# Patient Record
Sex: Male | Born: 1960 | Race: White | Hispanic: No | Marital: Single | Smoking: Never smoker
Health system: Southern US, Community
[De-identification: ages and names within clinical notes are randomized; demographics above are authoritative.]

## PROBLEM LIST (undated history)

## (undated) DIAGNOSIS — I10 Essential (primary) hypertension: Secondary | ICD-10-CM

## (undated) DIAGNOSIS — K6289 Other specified diseases of anus and rectum: Secondary | ICD-10-CM

## (undated) DIAGNOSIS — M1990 Unspecified osteoarthritis, unspecified site: Secondary | ICD-10-CM

## (undated) DIAGNOSIS — K649 Unspecified hemorrhoids: Secondary | ICD-10-CM

## (undated) DIAGNOSIS — E559 Vitamin D deficiency, unspecified: Secondary | ICD-10-CM

## (undated) DIAGNOSIS — Z973 Presence of spectacles and contact lenses: Secondary | ICD-10-CM

## (undated) DIAGNOSIS — K219 Gastro-esophageal reflux disease without esophagitis: Secondary | ICD-10-CM

## (undated) DIAGNOSIS — E782 Mixed hyperlipidemia: Secondary | ICD-10-CM

## (undated) DIAGNOSIS — T7840XA Allergy, unspecified, initial encounter: Secondary | ICD-10-CM

## (undated) DIAGNOSIS — N401 Enlarged prostate with lower urinary tract symptoms: Secondary | ICD-10-CM

## (undated) DIAGNOSIS — R7303 Prediabetes: Secondary | ICD-10-CM

## (undated) DIAGNOSIS — Z8719 Personal history of other diseases of the digestive system: Secondary | ICD-10-CM

## (undated) HISTORY — DX: Allergy, unspecified, initial encounter: T78.40XA

## (undated) HISTORY — DX: Gastro-esophageal reflux disease without esophagitis: K21.9

---
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
## 2007-11-23 ENCOUNTER — Ambulatory Visit: Payer: Self-pay | Admitting: Gastroenterology

## 2015-03-01 ENCOUNTER — Other Ambulatory Visit: Payer: Self-pay | Admitting: Family Medicine

## 2015-04-13 ENCOUNTER — Other Ambulatory Visit: Payer: Self-pay | Admitting: Family Medicine

## 2015-04-20 ENCOUNTER — Encounter: Payer: Self-pay | Admitting: Family Medicine

## 2015-04-20 ENCOUNTER — Ambulatory Visit (INDEPENDENT_AMBULATORY_CARE_PROVIDER_SITE_OTHER): Payer: BLUE CROSS/BLUE SHIELD | Admitting: Family Medicine

## 2015-04-20 VITALS — BP 116/79 | HR 73 | Temp 97.5°F | Resp 16 | Ht 69.0 in | Wt 213.4 lb

## 2015-04-20 DIAGNOSIS — Z23 Encounter for immunization: Secondary | ICD-10-CM | POA: Diagnosis not present

## 2015-04-20 DIAGNOSIS — K219 Gastro-esophageal reflux disease without esophagitis: Secondary | ICD-10-CM

## 2015-04-20 DIAGNOSIS — Z889 Allergy status to unspecified drugs, medicaments and biological substances status: Secondary | ICD-10-CM | POA: Diagnosis not present

## 2015-04-20 DIAGNOSIS — N400 Enlarged prostate without lower urinary tract symptoms: Secondary | ICD-10-CM | POA: Diagnosis not present

## 2015-04-20 DIAGNOSIS — Z0000 Encounter for general adult medical examination without abnormal findings: Secondary | ICD-10-CM | POA: Diagnosis not present

## 2015-04-20 DIAGNOSIS — E559 Vitamin D deficiency, unspecified: Secondary | ICD-10-CM | POA: Diagnosis not present

## 2015-04-20 MED ORDER — OMEPRAZOLE 20 MG PO CPDR: 20.0000 mg | DELAYED_RELEASE_CAPSULE | Freq: Every day | ORAL | Status: DC

## 2015-04-20 MED ORDER — SILODOSIN 4 MG PO CAPS: 4.0000 mg | ORAL_CAPSULE | Freq: Every day | ORAL | Status: DC

## 2015-04-20 NOTE — Progress Notes (Signed)
Name: Jonathan Ortega   MRN: [id number]    DOB: 07-Jul-1960   Date:04/20/2015       Progress Note  Subjective  Chief Complaint  Chief Complaint  Patient presents with  . Annual Exam    HPI Here for Annual Physical..  Has GERD,  BPH.  He is having some nocturia x 1.  Sl. More freq. During the day also.  No dysuria.  Stream is pretty good.   Some dribbling at end.  He would like better results. No problem-specific assessment & plan notes found for this encounter.   Past Medical History  Diagnosis Date  . Allergy   . GERD (gastroesophageal reflux disease)     History reviewed. No pertinent past surgical history.  Family History  Problem Relation Age of Onset  . Hypertension Paternal Uncle   . Hypertension Maternal Grandmother     Social History   Social History  . Marital Status: Single    Spouse Name: N/A  . Number of Children: N/A  . Years of Education: N/A   Occupational History  . Not on file.   Social History Main Topics  . Smoking status: Never Smoker   . Smokeless tobacco: Never Used  . Alcohol Use: No  . Drug Use: No  . Sexual Activity: Not on file   Other Topics Concern  . Not on file   Social History Narrative  . No narrative on file     Current outpatient prescriptions:  .  cholecalciferol (VITAMIN D) 1000 UNITS tablet, Take 1,000 Units by mouth daily., Disp: , Rfl:  .  loratadine (CLARITIN) 10 MG tablet, Take 10 mg by mouth daily., Disp: , Rfl:  .  omeprazole (PRILOSEC) 20 MG capsule, Take 1 capsule (20 mg total) by mouth daily., Disp: 90 capsule, Rfl: 3 .  silodosin (RAPAFLO) 4 MG CAPS capsule, Take 1 capsule (4 mg total) by mouth daily with breakfast., Disp: 90 capsule, Rfl: 3  No Known Allergies   Review of Systems  Constitutional: Negative for fever, chills, weight loss and malaise/fatigue.  HENT: Positive for congestion. Negative for ear pain, hearing loss and sore throat.   Eyes: Negative for blurred vision, double vision and pain.   Respiratory: Negative for cough, sputum production, shortness of breath and wheezing.   Cardiovascular: Negative for chest pain, palpitations and leg swelling.  Gastrointestinal: Positive for heartburn (rare). Negative for abdominal pain and blood in stool.  Genitourinary: Positive for frequency. Negative for dysuria and urgency.  Musculoskeletal: Negative for myalgias and joint pain.  Skin: Negative for itching and rash.  Neurological: Negative for dizziness, tremors, sensory change, focal weakness, weakness and headaches.      Objective  Filed Vitals:   04/20/15 1508  BP: 116/79  Pulse: 73  Temp: 97.5 F (36.4 C)  Resp: 16  Height: 5\' 9"  (1.753 m)  Weight: 213 lb 6.4 oz (96.798 kg)    Physical Exam  Constitutional: He is oriented to person, place, and time and well-developed, well-nourished, and in no distress. No distress.  HENT:  Head: Normocephalic and atraumatic.  Left Ear: External ear normal.  Mouth/Throat: Oropharynx is clear and moist.  Eyes: Conjunctivae and EOM are normal. Pupils are equal, round, and reactive to light. No scleral icterus.  Fundoscopic exam:      The right eye shows no arteriolar narrowing, no AV nicking, no exudate, no hemorrhage and no papilledema.       The left eye shows no arteriolar narrowing, no AV nicking,  no exudate, no hemorrhage and no papilledema.  Neck: Normal range of motion. Neck supple. Carotid bruit is not present. No thyromegaly present.  Cardiovascular: Normal rate, regular rhythm, normal heart sounds and intact distal pulses.  Exam reveals no gallop and no friction rub.   No murmur heard. Pulmonary/Chest: Effort normal and breath sounds normal. No respiratory distress. He has no wheezes. He has no rales. He exhibits no tenderness.  Abdominal: Soft. Bowel sounds are normal. He exhibits no distension, no abdominal bruit and no mass. There is no tenderness.  Genitourinary: Prostate normal and penis normal.  Musculoskeletal:  Normal range of motion. He exhibits no edema or tenderness.  Lymphadenopathy:    He has no cervical adenopathy.  Neurological: He is alert and oriented to person, place, and time. He displays normal reflexes. No cranial nerve deficit. He exhibits normal muscle tone. Gait normal. Coordination normal.  Skin: Skin is warm and dry.  Psychiatric: Mood, memory, affect and judgment normal.  Vitals reviewed.      No results found for this or any previous visit (from the past 2160 hour(s)).   Assessment & Plan  Problem List Items Addressed This Visit      Digestive   GERD (gastroesophageal reflux disease)   Relevant Medications   omeprazole (PRILOSEC) 20 MG capsule   Other Relevant Orders   CBC with Differential     Genitourinary   BPH (benign prostatic hyperplasia)   Relevant Medications   silodosin (RAPAFLO) 4 MG CAPS capsule   Other Relevant Orders   PSA     Other   H/O seasonal allergies   Vitamin D deficiency   Relevant Orders   VITAMIN D 25 Hydroxy (Vit-D Deficiency, Fractures)    Other Visit Diagnoses    Annual physical exam    -  Primary    Relevant Orders    Comprehensive Metabolic Panel (CMET)    Lipid Profile    Need for influenza vaccination        Relevant Orders    Flu Vaccine QUAD 36+ mos PF IM (Fluarix & Fluzone Quad PF)       Meds ordered this encounter  Medications  . DISCONTD: omeprazole (PRILOSEC) 20 MG capsule    Sig: Take 20 mg by mouth daily.  [name] loratadine (CLARITIN) 10 MG tablet    Sig: Take 10 mg by mouth daily.  . cholecalciferol (VITAMIN D) 1000 UNITS tablet    Sig: Take 1,000 Units by mouth daily.  [name] omeprazole (PRILOSEC) 20 MG capsule    Sig: Take 1 capsule (20 mg total) by mouth daily.    Dispense:  90 capsule    Refill:  3  . silodosin (RAPAFLO) 4 MG CAPS capsule    Sig: Take 1 capsule (4 mg total) by mouth daily with breakfast.    Dispense:  90 capsule    Refill:  3   1. Annual physical exam  - Comprehensive Metabolic Panel  (CMET) - Lipid Profile  2. Gastroesophageal reflux disease, esophagitis presence not specified  - CBC with Differential - omeprazole (PRILOSEC) 20 MG capsule; Take 1 capsule (20 mg total) by mouth daily.  Dispense: 90 capsule; Refill: 3  3. Need for influenza vaccination  - Flu Vaccine QUAD 36+ mos PF IM (Fluarix & Fluzone Quad PF)  4. H/O seasonal allergies   5. Vitamin D deficiency  - VITAMIN D 25 Hydroxy (Vit-D Deficiency, Fractures)  6. BPH (benign prostatic hyperplasia)  - PSA - silodosin (RAPAFLO) 4 MG CAPS capsule;  Take 1 capsule (4 mg total) by mouth daily with breakfast.  Dispense: 90 capsule; Refill: 3

## 2015-04-22 LAB — CBC WITH DIFFERENTIAL/PLATELET
BASOS ABS: 0 10*3/uL (ref 0.0–0.2)
BASOS: 0 %
EOS (ABSOLUTE): 0.3 10*3/uL (ref 0.0–0.4)
Eos: 4 %
HEMATOCRIT: 48.6 % (ref 37.5–51.0)
Hemoglobin: 16.3 g/dL (ref 12.6–17.7)
IMMATURE GRANULOCYTES: 0 %
Immature Grans (Abs): 0 10*3/uL (ref 0.0–0.1)
LYMPHS: 19 %
Lymphocytes Absolute: 1.7 10*3/uL (ref 0.7–3.1)
MCH: 31.6 pg (ref 26.6–33.0)
MCHC: 33.5 g/dL (ref 31.5–35.7)
MCV: 94 fL (ref 79–97)
MONOCYTES: 10 %
Monocytes Absolute: 0.8 10*3/uL (ref 0.1–0.9)
NEUTROS PCT: 67 %
Neutrophils Absolute: 5.9 10*3/uL (ref 1.4–7.0)
PLATELETS: 158 10*3/uL (ref 150–379)
RBC: 5.16 x10E6/uL (ref 4.14–5.80)
RDW: 13 % (ref 12.3–15.4)
WBC: 8.8 10*3/uL (ref 3.4–10.8)

## 2015-04-22 LAB — LIPID PANEL
CHOLESTEROL TOTAL: 186 mg/dL (ref 100–199)
Chol/HDL Ratio: 5 ratio units (ref 0.0–5.0)
HDL: 37 mg/dL — AB (ref >39)
LDL Calculated: 117 mg/dL — ABNORMAL HIGH (ref 0–99)
TRIGLYCERIDES: 159 mg/dL — AB (ref 0–149)
VLDL Cholesterol Cal: 32 mg/dL (ref 5–40)

## 2015-04-22 LAB — VITAMIN D 25 HYDROXY (VIT D DEFICIENCY, FRACTURES): Vit D, 25-Hydroxy: 42.2 ng/mL (ref 30.0–100.0)

## 2015-04-22 LAB — COMPREHENSIVE METABOLIC PANEL
A/G RATIO: 2 (ref 1.1–2.5)
ALK PHOS: 79 IU/L (ref 39–117)
ALT: 48 IU/L — AB (ref 0–44)
AST: 30 IU/L (ref 0–40)
Albumin: 4.7 g/dL (ref 3.5–5.5)
BUN/Creatinine Ratio: 11 (ref 9–20)
BUN: 12 mg/dL (ref 6–24)
Bilirubin Total: 1.3 mg/dL — ABNORMAL HIGH (ref 0.0–1.2)
CALCIUM: 9.4 mg/dL (ref 8.7–10.2)
CO2: 26 mmol/L (ref 18–29)
CREATININE: 1.12 mg/dL (ref 0.76–1.27)
Chloride: 100 mmol/L (ref 97–106)
GFR calc Af Amer: 86 mL/min/{1.73_m2} (ref >59)
GFR calc non Af Amer: 75 mL/min/{1.73_m2} (ref >59)
GLOBULIN, TOTAL: 2.3 g/dL (ref 1.5–4.5)
Glucose: 103 mg/dL — ABNORMAL HIGH (ref 65–99)
POTASSIUM: 4.5 mmol/L (ref 3.5–5.2)
SODIUM: 142 mmol/L (ref 136–144)
Total Protein: 7 g/dL (ref 6.0–8.5)

## 2015-04-22 LAB — PSA: Prostate Specific Ag, Serum: 0.7 ng/mL (ref 0.0–4.0)

## 2015-10-19 ENCOUNTER — Ambulatory Visit (INDEPENDENT_AMBULATORY_CARE_PROVIDER_SITE_OTHER): Payer: BLUE CROSS/BLUE SHIELD | Admitting: Family Medicine

## 2015-10-19 ENCOUNTER — Encounter: Payer: Self-pay | Admitting: Family Medicine

## 2015-10-19 VITALS — BP 134/71 | HR 81 | Temp 97.9°F | Resp 16 | Ht 69.0 in | Wt 217.0 lb

## 2015-10-19 DIAGNOSIS — R748 Abnormal levels of other serum enzymes: Secondary | ICD-10-CM | POA: Diagnosis not present

## 2015-10-19 DIAGNOSIS — N400 Enlarged prostate without lower urinary tract symptoms: Secondary | ICD-10-CM

## 2015-10-19 DIAGNOSIS — R7303 Prediabetes: Secondary | ICD-10-CM | POA: Insufficient documentation

## 2015-10-19 DIAGNOSIS — K219 Gastro-esophageal reflux disease without esophagitis: Secondary | ICD-10-CM

## 2015-10-19 DIAGNOSIS — R739 Hyperglycemia, unspecified: Secondary | ICD-10-CM

## 2015-10-19 DIAGNOSIS — R7309 Other abnormal glucose: Secondary | ICD-10-CM | POA: Diagnosis not present

## 2015-10-19 DIAGNOSIS — M7711 Lateral epicondylitis, right elbow: Secondary | ICD-10-CM | POA: Diagnosis not present

## 2015-10-19 MED ORDER — OMEPRAZOLE 20 MG PO CPDR: 20.0000 mg | DELAYED_RELEASE_CAPSULE | Freq: Every day | ORAL | Status: DC

## 2015-10-19 MED ORDER — SILODOSIN 4 MG PO CAPS: 4.0000 mg | ORAL_CAPSULE | Freq: Every day | ORAL | Status: DC

## 2015-10-19 NOTE — Progress Notes (Signed)
Name: Jonathan Ortega   MRN: [id number]    DOB: [birth year]   Date:10/19/2015       Progress Note  Subjective  Chief Complaint  Chief Complaint  Patient presents with  . Gastroesophageal Reflux    6 month follow up    HPI  Hee for f/u of GERD and BPH.  Has nocturia x 1.  Unchanged.  Has occ./ extra reflux.  Occ. Takes Zantac extra.  Overall feeling ok.    Does c/o R elbow pain with extra activity. No problem-specific assessment & plan notes found for this encounter.   Past Medical History  Diagnosis Date  . Allergy   . GERD (gastroesophageal reflux disease)     History reviewed. No pertinent past surgical history.  Family History  Problem Relation Age of Onset  . Hypertension Paternal Uncle   . Hypertension Maternal Grandmother     Social History   Social History  . Marital Status: Single    Spouse Name: N/A  . Number of Children: N/A  . Years of Education: N/A   Occupational History  . Not on file.   Social History Main Topics  . Smoking status: Never Smoker   . Smokeless tobacco: Never Used  . Alcohol Use: 0.0 oz/week    0 Standard drinks or equivalent per week  . Drug Use: No  . Sexual Activity: Not on file   Other Topics Concern  . Not on file   Social History Narrative     Current outpatient prescriptions:  .  cholecalciferol (VITAMIN D) 1000 UNITS tablet, Take 1,000 Units by mouth daily., Disp: , Rfl:  .  loratadine (CLARITIN) 10 MG tablet, Take 10 mg by mouth daily., Disp: , Rfl:  .  omeprazole (PRILOSEC) 20 MG capsule, Take 1 capsule (20 mg total) by mouth daily., Disp: 90 capsule, Rfl: 3 .  silodosin (RAPAFLO) 4 MG CAPS capsule, Take 1 capsule (4 mg total) by mouth daily with breakfast., Disp: 90 capsule, Rfl: 3  Not on File   Review of Systems  Constitutional: Negative for fever, chills, weight loss and malaise/fatigue.  HENT: Negative for hearing loss.   Eyes: Negative for blurred vision and double vision.  Respiratory: Negative for  cough, shortness of breath and wheezing.   Cardiovascular: Negative for chest pain, palpitations and leg swelling.  Gastrointestinal: Positive for heartburn (occ.). Negative for abdominal pain and blood in stool.  Genitourinary: Positive for frequency. Negative for dysuria and urgency.  Musculoskeletal: Positive for joint pain (R elbow). Negative for myalgias.  Skin: Negative for rash.  Neurological: Negative for dizziness, tremors, weakness and headaches.      Objective  Filed Vitals:   10/19/15 0810  BP: 134/71  Pulse: 81  Temp: 97.9 F (36.6 C)  TempSrc: Oral  Resp: 16  Height: 5\' 9"  (1.753 m)  Weight: 217 lb (98.431 kg)    Physical Exam  Constitutional: He is oriented to person, place, and time and well-developed, well-nourished, and in no distress. No distress.  HENT:  Head: Normocephalic and atraumatic.  Eyes: Conjunctivae and EOM are normal. Pupils are equal, round, and reactive to light. No scleral icterus.  Neck: Normal range of motion. Neck supple. Carotid bruit is not present. No thyromegaly present.  Cardiovascular: Normal rate, regular rhythm and normal heart sounds.  Exam reveals no gallop and no friction rub.   No murmur heard. Pulmonary/Chest: Effort normal and breath sounds normal. No respiratory distress. He has no wheezes. He has no rales.  Abdominal:  Soft. Bowel sounds are normal. He exhibits no distension and no mass. There is no tenderness.  Musculoskeletal: He exhibits no edema.  Lymphadenopathy:    He has no cervical adenopathy.  Neurological: He is alert and oriented to person, place, and time.  Vitals reviewed.      No results found for this or any previous visit (from the past 2160 hour(s)).   Assessment & Plan  Problem List Items Addressed This Visit      Digestive   GERD (gastroesophageal reflux disease) - Primary   Relevant Medications   omeprazole (PRILOSEC) 20 MG capsule     Musculoskeletal and Integument   Lateral  epicondylitis of right elbow     Genitourinary   BPH (benign prostatic hyperplasia)   Relevant Medications   silodosin (RAPAFLO) 4 MG CAPS capsule     Other   Elevated blood sugar   Relevant Orders   HgB A1c   Elevated liver enzymes   Relevant Orders   Comprehensive Metabolic Panel (CMET)      Meds ordered this encounter  Medications  . omeprazole (PRILOSEC) 20 MG capsule    Sig: Take 1 capsule (20 mg total) by mouth daily.    Dispense:  90 capsule    Refill:  3  . silodosin (RAPAFLO) 4 MG CAPS capsule    Sig: Take 1 capsule (4 mg total) by mouth daily with breakfast.    Dispense:  90 capsule    Refill:  3   1. Gastroesophageal reflux disease without esophagitis  - omeprazole (PRILOSEC) 20 MG capsule; Take 1 capsule (20 mg total) by mouth daily.  Dispense: 90 capsule; Refill: 3  2. BPH (benign prostatic hyperplasia)  - silodosin (RAPAFLO) 4 MG CAPS capsule; Take 1 capsule (4 mg total) by mouth daily with breakfast.  Dispense: 90 capsule; Refill: 3  3. Elevated blood sugar  - HgB A1c  4. Elevated liver enzymes  - Comprehensive Metabolic Panel (CMET)  5. Gastroesophageal reflux disease, esophagitis presence not specified   6. 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

## 2015-10-20 LAB — COMPREHENSIVE METABOLIC PANEL
A/G RATIO: 1.8 (ref 1.2–2.2)
ALBUMIN: 4.4 g/dL (ref 3.5–5.5)
ALK PHOS: 85 IU/L (ref 39–117)
ALT: 57 IU/L — ABNORMAL HIGH (ref 0–44)
AST: 41 IU/L — AB (ref 0–40)
BUN / CREAT RATIO: 12 (ref 9–20)
BUN: 13 mg/dL (ref 6–24)
Bilirubin Total: 0.9 mg/dL (ref 0.0–1.2)
CALCIUM: 9.1 mg/dL (ref 8.7–10.2)
CO2: 24 mmol/L (ref 18–29)
Chloride: 101 mmol/L (ref 96–106)
Creatinine, Ser: 1.11 mg/dL (ref 0.76–1.27)
GFR calc Af Amer: 87 mL/min/{1.73_m2} (ref >59)
GFR, EST NON AFRICAN AMERICAN: 75 mL/min/{1.73_m2} (ref >59)
GLOBULIN, TOTAL: 2.4 g/dL (ref 1.5–4.5)
Glucose: 112 mg/dL — ABNORMAL HIGH (ref 65–99)
POTASSIUM: 4.3 mmol/L (ref 3.5–5.2)
SODIUM: 139 mmol/L (ref 134–144)
Total Protein: 6.8 g/dL (ref 6.0–8.5)

## 2015-10-20 LAB — HEMOGLOBIN A1C
ESTIMATED AVERAGE GLUCOSE: 123 mg/dL
Hgb A1c MFr Bld: 5.9 % — ABNORMAL HIGH (ref 4.8–5.6)

## 2015-10-22 LAB — HEPATITIS A ANTIBODY, IGM: HEP A IGM: NEGATIVE

## 2015-10-22 LAB — HEPATITIS C ANTIBODY: Hep C Virus Ab: <0.1 s/co ratio (ref 0.0–0.9)

## 2015-10-22 LAB — HEPATITIS B CORE ANTIBODY, IGM: HEP B C IGM: NEGATIVE

## 2015-10-22 LAB — SPECIMEN STATUS REPORT

## 2016-01-26 ENCOUNTER — Telehealth: Payer: Self-pay | Admitting: Family Medicine

## 2016-01-26 MED ORDER — HYDROCORTISONE ACETATE 25 MG RE SUPP: 25.0000 mg | Freq: Two times a day (BID) | RECTAL | 0 refills | Status: DC

## 2016-01-26 NOTE — Telephone Encounter (Signed)
Tell him I can senc in Cortisone suppositiries x 1.  Any more, I would have to see him for the problem.  Send in Anusol [facility] suppositories, #12, 1 in rectum twice daily for 6 days.  No refills.-jh

## 2016-01-26 NOTE — Telephone Encounter (Signed)
rx sent patient notified he will need appt for future flares.

## 2016-01-26 NOTE — Telephone Encounter (Signed)
Pt. Called requesting a refill on Hydrocortisone ac 25 mg   (this medication was given to  Him from a  Dr. At [facility] he  No longer go to )  This is for  Hemorrhoids flare up . Pt call back # is  [phone]

## 2016-04-25 ENCOUNTER — Encounter: Payer: Self-pay | Admitting: Family Medicine

## 2016-04-25 ENCOUNTER — Ambulatory Visit (INDEPENDENT_AMBULATORY_CARE_PROVIDER_SITE_OTHER): Payer: BLUE CROSS/BLUE SHIELD | Admitting: Family Medicine

## 2016-04-25 VITALS — BP 130/86 | HR 87 | Temp 98.3°F | Resp 16 | Ht 69.0 in | Wt 208.0 lb

## 2016-04-25 DIAGNOSIS — Z23 Encounter for immunization: Secondary | ICD-10-CM

## 2016-04-25 DIAGNOSIS — K219 Gastro-esophageal reflux disease without esophagitis: Secondary | ICD-10-CM | POA: Diagnosis not present

## 2016-04-25 DIAGNOSIS — Z889 Allergy status to unspecified drugs, medicaments and biological substances status: Secondary | ICD-10-CM

## 2016-04-25 DIAGNOSIS — K640 First degree hemorrhoids: Secondary | ICD-10-CM

## 2016-04-25 DIAGNOSIS — R748 Abnormal levels of other serum enzymes: Secondary | ICD-10-CM

## 2016-04-25 DIAGNOSIS — N401 Enlarged prostate with lower urinary tract symptoms: Secondary | ICD-10-CM

## 2016-04-25 DIAGNOSIS — R739 Hyperglycemia, unspecified: Secondary | ICD-10-CM

## 2016-04-25 DIAGNOSIS — R351 Nocturia: Secondary | ICD-10-CM

## 2016-04-25 DIAGNOSIS — K649 Unspecified hemorrhoids: Secondary | ICD-10-CM | POA: Insufficient documentation

## 2016-04-25 LAB — COMPLETE METABOLIC PANEL WITH GFR
ALT: 50 U/L — AB (ref 9–46)
AST: 36 U/L — AB (ref 10–35)
Albumin: 4.7 g/dL (ref 3.6–5.1)
Alkaline Phosphatase: 65 U/L (ref 40–115)
BUN: 13 mg/dL (ref 7–25)
CALCIUM: 9.3 mg/dL (ref 8.6–10.3)
CHLORIDE: 102 mmol/L (ref 98–110)
CO2: 31 mmol/L (ref 20–31)
CREATININE: 0.99 mg/dL (ref 0.70–1.33)
GFR, Est African American: >89 mL/min (ref >=60)
GFR, Est Non African American: 86 mL/min (ref >=60)
GLUCOSE: 97 mg/dL (ref 65–99)
POTASSIUM: 4.1 mmol/L (ref 3.5–5.3)
SODIUM: 139 mmol/L (ref 135–146)
Total Bilirubin: 1.5 mg/dL — ABNORMAL HIGH (ref 0.2–1.2)
Total Protein: 7.3 g/dL (ref 6.1–8.1)

## 2016-04-25 LAB — LIPID PANEL
CHOL/HDL RATIO: 5.2 ratio — AB (ref <5.0)
Cholesterol: 196 mg/dL (ref <200)
HDL: 38 mg/dL — AB (ref >40)
LDL Cholesterol: 130 mg/dL — ABNORMAL HIGH (ref <100)
Triglycerides: 138 mg/dL (ref <150)
VLDL: 28 mg/dL (ref <30)

## 2016-04-25 LAB — PSA: PSA: 0.5 ng/mL (ref <=4.0)

## 2016-04-25 MED ORDER — HYDROCORTISONE ACETATE 25 MG RE SUPP: 25.0000 mg | Freq: Two times a day (BID) | RECTAL | 3 refills | Status: DC

## 2016-04-25 MED ORDER — SILODOSIN 8 MG PO CAPS: 8.0000 mg | ORAL_CAPSULE | Freq: Every day | ORAL | 3 refills | Status: DC

## 2016-04-25 MED ORDER — OMEPRAZOLE 20 MG PO CPDR: 20.0000 mg | DELAYED_RELEASE_CAPSULE | Freq: Every day | ORAL | 3 refills | Status: DC

## 2016-04-25 NOTE — Progress Notes (Signed)
Name: Jonathan Ortega   MRN: [id number]    DOB: Jan 30, 1961   Date:04/25/2016       Progress Note  Subjective  Chief Complaint  Chief Complaint  Patient presents with  . Follow-up    GERD    HPI  Here for f/u of GERD, Elevated sugar, elevated liver enzymes, BPH.  Having some increased nocturia. No problem-specific Assessment & Plan notes found for this encounter.   Past Medical History:  Diagnosis Date  . Allergy   . GERD (gastroesophageal reflux disease)     History reviewed. No pertinent surgical history.  Family History  Problem Relation Age of Onset  . Hypertension Paternal Uncle   . Hypertension Maternal Grandmother     Social History   Social History  . Marital status: Single    Spouse name: N/A  . Number of children: N/A  . Years of education: N/A   Occupational History  . Not on file.   Social History Main Topics  . Smoking status: Never Smoker  . Smokeless tobacco: Never Used  . Alcohol use 0.0 oz/week     Comment: have not had a drink since last month   . Drug use: No  . Sexual activity: Not on file   Other Topics Concern  . Not on file   Social History Narrative  . No narrative on file     Current Outpatient Prescriptions:  .  cholecalciferol (VITAMIN D) 1000 UNITS tablet, Take 1,000 Units by mouth daily., Disp: , Rfl:  .  hydrocortisone (ANUSOL-HC) 25 MG suppository, Place 1 suppository (25 mg total) rectally 2 (two) times daily., Disp: 12 suppository, Rfl: 3 .  loratadine (CLARITIN) 10 MG tablet, Take 10 mg by mouth daily., Disp: , Rfl:  .  omeprazole (PRILOSEC) 20 MG capsule, Take 1 capsule (20 mg total) by mouth daily., Disp: 90 capsule, Rfl: 3 .  silodosin (RAPAFLO) 8 MG CAPS capsule, Take 1 capsule (8 mg total) by mouth daily with breakfast., Disp: 90 capsule, Rfl: 3  No Known Allergies   Review of Systems  Constitutional: Positive for weight loss (desired). Negative for chills, fever and malaise/fatigue.  HENT: Negative for  hearing loss and tinnitus.   Eyes: Negative for blurred vision and double vision.  Respiratory: Negative for cough, shortness of breath and wheezing.   Cardiovascular: Negative for chest pain, palpitations and leg swelling.  Gastrointestinal: Negative for abdominal pain, blood in stool and heartburn.  Genitourinary: Positive for frequency (nocduria). Negative for dysuria and urgency.  Musculoskeletal: Negative for back pain and myalgias.  Skin: Negative for rash.  Neurological: Negative for dizziness, tingling, tremors, weakness and headaches.      Objective  Vitals:   04/25/16 0836 04/25/16 0839  BP: (!) 137/91 130/86  Pulse: 86 87  Resp: 16   Temp: 98.3 F (36.8 C)   TempSrc: Oral   Weight: 94.3 kg (208 lb)   Height: 5\' 9"  (1.753 m)     Physical Exam  Constitutional: He is oriented to person, place, and time and well-developed, well-nourished, and in no distress. No distress.  HENT:  Head: Normocephalic and atraumatic.  Eyes: Conjunctivae and EOM are normal. Pupils are equal, round, and reactive to light. No scleral icterus.  Neck: Normal range of motion. Neck supple. Carotid bruit is not present. No thyromegaly present.  Cardiovascular: Normal rate, regular rhythm and normal heart sounds.  Exam reveals no gallop and no friction rub.   No murmur heard. Pulmonary/Chest: Effort normal and breath sounds  normal. No respiratory distress. He has no wheezes. He has no rales.  Abdominal: Soft. Bowel sounds are normal. He exhibits no distension, no abdominal bruit and no mass. There is no tenderness.  Musculoskeletal: He exhibits no edema.  Lymphadenopathy:    He has no cervical adenopathy.  Neurological: He is alert and oriented to person, place, and time.  Vitals reviewed.      No results found for this or any previous visit (from the past 2160 hour(s)).   Assessment & Plan  Problem List Items Addressed This Visit      Cardiovascular and Mediastinum   Hemorrhoids    Relevant Medications   hydrocortisone (ANUSOL-HC) 25 MG suppository     Digestive   GERD (gastroesophageal reflux disease)   Relevant Medications   omeprazole (PRILOSEC) 20 MG capsule     Genitourinary   BPH (benign prostatic hyperplasia) - Primary   Relevant Medications   silodosin (RAPAFLO) 8 MG CAPS capsule   Other Relevant Orders   PSA     Other   H/O seasonal allergies   Elevated blood sugar   Relevant Orders   HgB A1c   Elevated liver enzymes   Relevant Orders   COMPLETE METABOLIC PANEL WITH GFR   Lipid panel    Other Visit Diagnoses    Immunization due          Meds ordered this encounter  Medications  . silodosin (RAPAFLO) 8 MG CAPS capsule    Sig: Take 1 capsule (8 mg total) by mouth daily with breakfast.    Dispense:  90 capsule    Refill:  3  . hydrocortisone (ANUSOL-HC) 25 MG suppository    Sig: Place 1 suppository (25 mg total) rectally 2 (two) times daily.    Dispense:  12 suppository    Refill:  3  . omeprazole (PRILOSEC) 20 MG capsule    Sig: Take 1 capsule (20 mg total) by mouth daily.    Dispense:  90 capsule    Refill:  3   1. Benign prostatic hyperplasia with nocturia  - PSA - silodosin (RAPAFLO) 8 MG CAPS capsule; Take 1 capsule (8 mg total) by mouth daily with breakfast.  Dispense: 90 capsule; Refill: 3  2. Gastroesophageal reflux disease without esophagitis  - omeprazole (PRILOSEC) 20 MG capsule; Take 1 capsule (20 mg total) by mouth daily.  Dispense: 90 capsule; Refill: 3  3. Elevated liver enzymes  - COMPLETE METABOLIC PANEL WITH GFR - Lipid panel  4. Elevated blood sugar  - HgB A1c  5. H/O seasonal allergies   6. Grade I hemorrhoids  - hydrocortisone (ANUSOL-HC) 25 MG suppository; Place 1 suppository (25 mg total) rectally 2 (two) times daily.  Dispense: 12 suppository; Refill: 3  7. Immunization due  - Flu Vaccine QUAD 36+ mos IM (Fluarix & Fluzone Quad PF

## 2016-04-26 LAB — HEMOGLOBIN A1C
HEMOGLOBIN A1C: 5.3 % (ref <5.7)
Mean Plasma Glucose: 105 mg/dL

## 2016-10-27 ENCOUNTER — Ambulatory Visit (INDEPENDENT_AMBULATORY_CARE_PROVIDER_SITE_OTHER): Payer: BLUE CROSS/BLUE SHIELD | Admitting: Family Medicine

## 2016-10-27 ENCOUNTER — Encounter: Payer: Self-pay | Admitting: Family Medicine

## 2016-10-27 VITALS — BP 134/86 | HR 77 | Temp 98.2°F | Resp 16 | Ht 69.0 in | Wt 209.0 lb

## 2016-10-27 DIAGNOSIS — Z1211 Encounter for screening for malignant neoplasm of colon: Secondary | ICD-10-CM | POA: Diagnosis not present

## 2016-10-27 DIAGNOSIS — K640 First degree hemorrhoids: Secondary | ICD-10-CM | POA: Diagnosis not present

## 2016-10-27 DIAGNOSIS — Z0000 Encounter for general adult medical examination without abnormal findings: Secondary | ICD-10-CM

## 2016-10-27 DIAGNOSIS — E785 Hyperlipidemia, unspecified: Secondary | ICD-10-CM | POA: Insufficient documentation

## 2016-10-27 DIAGNOSIS — N401 Enlarged prostate with lower urinary tract symptoms: Secondary | ICD-10-CM

## 2016-10-27 DIAGNOSIS — R748 Abnormal levels of other serum enzymes: Secondary | ICD-10-CM

## 2016-10-27 DIAGNOSIS — E559 Vitamin D deficiency, unspecified: Secondary | ICD-10-CM | POA: Diagnosis not present

## 2016-10-27 DIAGNOSIS — K219 Gastro-esophageal reflux disease without esophagitis: Secondary | ICD-10-CM

## 2016-10-27 DIAGNOSIS — E782 Mixed hyperlipidemia: Secondary | ICD-10-CM | POA: Diagnosis not present

## 2016-10-27 DIAGNOSIS — R7303 Prediabetes: Secondary | ICD-10-CM | POA: Diagnosis not present

## 2016-10-27 DIAGNOSIS — R351 Nocturia: Secondary | ICD-10-CM

## 2016-10-27 LAB — COMPLETE METABOLIC PANEL WITH GFR
ALT: 46 U/L (ref 9–46)
AST: 32 U/L (ref 10–35)
Albumin: 4.4 g/dL (ref 3.6–5.1)
Alkaline Phosphatase: 69 U/L (ref 40–115)
BUN: 11 mg/dL (ref 7–25)
CHLORIDE: 104 mmol/L (ref 98–110)
CO2: 26 mmol/L (ref 20–31)
CREATININE: 1.08 mg/dL (ref 0.70–1.33)
Calcium: 9.2 mg/dL (ref 8.6–10.3)
GFR, EST AFRICAN AMERICAN: 89 mL/min (ref >=60)
GFR, EST NON AFRICAN AMERICAN: 77 mL/min (ref >=60)
Glucose, Bld: 102 mg/dL — ABNORMAL HIGH (ref 65–99)
Potassium: 4.4 mmol/L (ref 3.5–5.3)
SODIUM: 140 mmol/L (ref 135–146)
Total Bilirubin: 1.5 mg/dL — ABNORMAL HIGH (ref 0.2–1.2)
Total Protein: 7 g/dL (ref 6.1–8.1)

## 2016-10-27 LAB — CBC WITH DIFFERENTIAL/PLATELET
BASOS ABS: 0 {cells}/uL (ref 0–200)
Basophils Relative: 0 %
EOS PCT: 4 %
Eosinophils Absolute: 352 cells/uL (ref 15–500)
HCT: 47.5 % (ref 38.5–50.0)
Hemoglobin: 16.3 g/dL (ref 13.2–17.1)
LYMPHS PCT: 19 %
Lymphs Abs: 1672 cells/uL (ref 850–3900)
MCH: 31.8 pg (ref 27.0–33.0)
MCHC: 34.3 g/dL (ref 32.0–36.0)
MCV: 92.6 fL (ref 80.0–100.0)
MPV: 10.2 fL (ref 7.5–12.5)
Monocytes Absolute: 792 cells/uL (ref 200–950)
Monocytes Relative: 9 %
NEUTROS PCT: 68 %
Neutro Abs: 5984 cells/uL (ref 1500–7800)
Platelets: 176 10*3/uL (ref 140–400)
RBC: 5.13 MIL/uL (ref 4.20–5.80)
RDW: 13 % (ref 11.0–15.0)
WBC: 8.8 10*3/uL (ref 3.8–10.8)

## 2016-10-27 LAB — LIPID PANEL
CHOLESTEROL: 179 mg/dL (ref <200)
HDL: 35 mg/dL — ABNORMAL LOW (ref >40)
LDL CALC: 111 mg/dL — AB (ref <100)
TRIGLYCERIDES: 167 mg/dL — AB (ref <150)
Total CHOL/HDL Ratio: 5.1 Ratio — ABNORMAL HIGH (ref <5.0)
VLDL: 33 mg/dL — ABNORMAL HIGH (ref <30)

## 2016-10-27 MED ORDER — HYDROCORTISONE ACETATE 25 MG RE SUPP: 25.0000 mg | Freq: Two times a day (BID) | RECTAL | 5 refills | Status: DC | PRN

## 2016-10-27 NOTE — Assessment & Plan Note (Signed)
Last lipids 04/2016 mixed abnormal Not on statin therapy or OTC cholesterol therapy Encourage to improve exercise, inc walking, improve diet Check fasting lipids and labs today Follow-up q 6 mo, only check lipids yearly - discussed indication for statin therapy and will calculate ASCVD risk consider options

## 2016-10-27 NOTE — Assessment & Plan Note (Signed)
Stable chronic elevated LFTs, without clear etiology. Most likely fatty liver disease, no recent imaging - Not on statin therapy or provoking med - Consider future abdominal US for evaluation - Encourage healthy lifestyle improve exercise - Check fasting labs, lipids - Follow-up q 6 mo

## 2016-10-27 NOTE — Assessment & Plan Note (Addendum)
History of vitamin D deficiency, had been on higher dose Vitamin D, last normal 2016 Continue Vitamin D 1,000 iu daily for now, advised maintenance dose up to 2,000 daily Check result today

## 2016-10-27 NOTE — Patient Instructions (Signed)
Thank you for coming to the clinic today.  1. Keep up the good work - Continue working on regular walking exercise, may add an extra day if possible - Limit carbs in food  2. Checking labs today, will notify you with results next week  3. Plan for future referral to [facility] for GERD acid reflux and possible Endoscopy upper scope and future Colonoscopy in 2019  Please schedule a follow-up appointment with Dr. Parks Ranger in 6 months for GERD, BPH, Pre-DM A1c  If you have any other questions or concerns, please feel free to call the clinic or send a message through [facility]. You may also schedule an earlier appointment if necessary.  [name], DO [place]

## 2016-10-27 NOTE — Assessment & Plan Note (Signed)
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

## 2016-10-27 NOTE — Assessment & Plan Note (Signed)
Stable chronic GERD without complication. - No GI red flag symptoms. Exam is unremarkable with benign abdomen without pain today - Long-term PPI use - Last EGD 18 years ago, small hiatal hernia  Plan: 1. Continue Omeprazole 20mg  daily 30 min prior to 1st meal, discussed future taper plan, advised risk/benefits, long-term potential risks/side effects. Discussed would need to gradually taper off due to rebound effect. However concern with family history esophageal cancer, unclear if PPI would lower his risk 2. Diet modifications reduce GERD 3. Follow-up 6 months - anticipate GI referral at that time to explore option of EGD and colonoscopy in 2019, for surveillance, consider future taper off PPI

## 2016-10-27 NOTE — Progress Notes (Signed)
Subjective:    Patient ID: Jonathan Ortega, male    DOB: Mar 11, 1961, 56 y.o.   MRN: [id number]  Jonathan Ortega is a 56 y.o. male presenting on 10/27/2016 for Annual Exam   HPI   GERD: - Reports chronic problem for >20 years ago on antacid medications, had tried Zantac, Nexium in past, then ins switch to Omeprazole - Currently taking Omeprazole 20mg  daily - Has seen GI in past, had EGD about 18+ years ago, had a hiatal hernia in past - Mother's side of family with esophageal cancer.  Pre-Diabetes - Reports prior history of elevated blood sugar and A1c up to >5.9 in past. Never dx with DM - Never on medication - Diet: Tries to limit sugar in diet. Not really trying a specific diet, tries to avoid some carbs but not strictly. Does eat fruits including watermelon. Rarely drinks soda (< 2 per week), drinks unsweet tea, no sugar added to coffee, drinks about 1-2 bottles of water. - Exercise: Tries to walk about 4 mile (route) 2-3 times a week - No significant family history of DM Denies hypoglycemia  HYPERLIPIDEMIA: - Reports no concerns. Last lipid panel 04/2016, with some abnormal LDL and HDL - Taking ASA 81mg  daily for primary prevention. No known CAD / MI / CVA - Not taking any medicines OTC or rx for cholesterol. Never on statin.  Elevated LFTs - Reports chronic history of elevated LFTs, has been told this for >20 years by his report, no prior ultrasound or other imaging test to diagnose this.  BPH with LUTS, controlled - Reports chronic problem for >5 years. Taking Rapaflo 8mg  daily now with good results. - Previously seen by [facility] Dr Yves Dill in past, no longer plan to follow-up, several years ago he was offered daily Cialis for BPH as well. He has concerns of insurance coverage.  AUA BPH Symptom Score over past 1 month 1. Sensation of not emptying bladder post void - 0 2. Urinate less than 2 hour after finish last void - 1 (worse if drinks more fluids) 3. Start/Stop  several times during void - 0 4. Difficult to postpone urination - 1 5. Weak urinary stream - 1 (towards end of void) 6. Push or strain urination - 0 7. Nocturia - 0-1 times (work schedule is different harder to day)  Total Score: 3-4 (Mild BPH symptoms)  Health Maintenance - Prostate cancer screening: No known personal or family history, recent PSA 0.7 to 0.5 (2017), last DRE reported prostate was only mildly enlarged - Colonoscopy last done 11/2007 (Dr Loistine Simas, [facility]), normal only benign polyps, good for 10 years  Past Medical History:  Diagnosis Date  . Allergy   . GERD (gastroesophageal reflux disease)    History reviewed. No pertinent surgical history. Social History   Social History  . Marital status: Single    Spouse name: N/A  . Number of children: N/A  . Years of education: N/A   Occupational History  . Not on file.   Social History Main Topics  . Smoking status: Never Smoker  . Smokeless tobacco: Never Used  . Alcohol use 0.0 oz/week     Comment: have not had a drink since last month   . Drug use: No  . Sexual activity: Not on file   Other Topics Concern  . Not on file   Social History Narrative  . No narrative on file   Family History  Problem Relation Age of Onset  . Hypertension  Paternal Uncle   . Hypertension Maternal Grandmother   . Esophageal cancer Maternal Grandmother    Current Outpatient Prescriptions on File Prior to Visit  Medication Sig  . cholecalciferol (VITAMIN D) 1000 UNITS tablet Take 1,000 Units by mouth daily.  [name] loratadine (CLARITIN) 10 MG tablet Take 10 mg by mouth daily.  [name] omeprazole (PRILOSEC) 20 MG capsule Take 1 capsule (20 mg total) by mouth daily.  . silodosin (RAPAFLO) 8 MG CAPS capsule Take 1 capsule (8 mg total) by mouth daily with breakfast.   No current facility-administered medications on file prior to visit.     Review of Systems  Constitutional: Negative for activity change, appetite change,  chills, diaphoresis, fatigue, fever and unexpected weight change.  HENT: Negative for congestion, hearing loss and postnasal drip.   Eyes: Negative for visual disturbance.  Respiratory: Negative for apnea, cough, chest tightness, shortness of breath and wheezing.   Cardiovascular: Negative for chest pain, palpitations and leg swelling.  Gastrointestinal: Negative for abdominal pain, constipation, diarrhea, nausea and vomiting.  Endocrine: Negative for cold intolerance and polyuria.  Genitourinary: Negative for difficulty urinating, dysuria, frequency, hematuria, testicular pain and urgency.  Musculoskeletal: Negative for arthralgias, back pain and neck pain.  Skin: Negative for rash.  Neurological: Negative for dizziness, weakness, light-headedness, numbness and headaches.  Hematological: Negative for adenopathy.  Psychiatric/Behavioral: Negative for behavioral problems, dysphoric mood and sleep disturbance. The patient is not nervous/anxious.    Per HPI unless specifically indicated above    Objective:    BP 134/86   Pulse 77   Temp 98.2 F (36.8 C) (Oral)   Resp 16   Ht 5\' 9"  (1.753 m)   Wt 209 lb (94.8 kg)   BMI 30.86 kg/m   Wt Readings from Last 3 Encounters:  10/27/16 209 lb (94.8 kg)  04/25/16 208 lb (94.3 kg)  10/19/15 217 lb (98.4 kg)    Physical Exam  Constitutional: He is oriented to person, place, and time. He appears well-developed and well-nourished. No distress.  Well-appearing, comfortable, cooperative  HENT:  Head: Normocephalic and atraumatic.  Mouth/Throat: Oropharynx is clear and moist.  Frontal / maxillary sinuses non-tender. Nares patent without purulence or edema. Bilateral TMs clear without erythema, effusion or bulging. Oropharynx clear without erythema, exudates, edema or asymmetry.  Eyes: Conjunctivae and EOM are normal. Pupils are equal, round, and reactive to light.  Neck: Normal range of motion. Neck supple. No thyromegaly present.  No carotid  bruits  Cardiovascular: Normal rate, regular rhythm, normal heart sounds and intact distal pulses.   No murmur heard. Pulmonary/Chest: Effort normal and breath sounds normal. No respiratory distress. He has no wheezes. He has no rales.  Abdominal: Soft. Bowel sounds are normal. He exhibits no distension and no mass. There is no tenderness.  Genitourinary:  Genitourinary Comments: Deferred DRE  Musculoskeletal: Normal range of motion. He exhibits no edema or tenderness.  Lymphadenopathy:    He has no cervical adenopathy.  Neurological: He is alert and oriented to person, place, and time.  Skin: Skin is warm and dry. No rash noted. He is not diaphoretic.  Psychiatric: He has a normal mood and affect. His behavior is normal.  Well groomed, good eye contact, normal speech and thoughts  Nursing note and vitals reviewed.  Results for orders placed or performed in visit on 04/25/16  COMPLETE METABOLIC PANEL WITH GFR  Result Value Ref Range   Sodium 139 135 - 146 mmol/L   Potassium 4.1 3.5 - 5.3 mmol/L  Chloride 102 98 - 110 mmol/L   CO2 31 20 - 31 mmol/L   Glucose, Bld 97 65 - 99 mg/dL   BUN 13 7 - 25 mg/dL   Creat 0.99 0.70 - 1.33 mg/dL   Total Bilirubin 1.5 (H) 0.2 - 1.2 mg/dL   Alkaline Phosphatase 65 40 - 115 U/L   AST 36 (H) 10 - 35 U/L   ALT 50 (H) 9 - 46 U/L   Total Protein 7.3 6.1 - 8.1 g/dL   Albumin 4.7 3.6 - 5.1 g/dL   Calcium 9.3 8.6 - 10.3 mg/dL   GFR, Est African American >89 >=60 mL/min   GFR, Est Non African American 86 >=60 mL/min  PSA  Result Value Ref Range   PSA 0.5 <=4.0 ng/mL  Lipid panel  Result Value Ref Range   Cholesterol 196 <200 mg/dL   Triglycerides 138 <150 mg/dL   HDL 38 (L) >40 mg/dL   Total CHOL/HDL Ratio 5.2 (H) <5.0 Ratio   VLDL 28 <30 mg/dL   LDL Cholesterol 130 (H) <100 mg/dL  HgB A1c  Result Value Ref Range   Hgb A1c MFr Bld 5.3 <5.7 %   Mean Plasma Glucose 105 mg/dL      Assessment & Plan:   Problem List Items Addressed This Visit     Vitamin D deficiency    History of vitamin D deficiency, had been on higher dose Vitamin D, last normal 2016 Continue Vitamin D 1,000 iu daily for now, advised maintenance dose up to 2,000 daily Check result today      Relevant Orders   VITAMIN D 25 Hydroxy (Vit-D Deficiency, Fractures)   Screening for colon cancer    Average risk patient. Up to date on colonoscopy, last 2009, due 2019 next year - 1st colonoscopy few polyps, benign, advised 10 years - Will need new referral to [facility] in future for scheduling colonoscopy, considering doing EGD as well if needed for GERD, will defer decision to GI - Follow-up for now, no referral placed yet, return sooner if alarm symptoms      Pre-diabetes    Stable chronic Pre-DM, with A1c < 6.0 No known complications Never on medications Encourage improve diet, exercise Follow-up A1c result today and q 6 mo      Relevant Orders   Hemoglobin A1c   Hyperlipidemia    Last lipids 04/2016 mixed abnormal Not on statin therapy or OTC cholesterol therapy Encourage to improve exercise, inc walking, improve diet Check fasting lipids and labs today Follow-up q 6 mo, only check lipids yearly - discussed indication for statin therapy and will calculate ASCVD risk consider options      Relevant Medications   aspirin EC 81 MG tablet   Other Relevant Orders   Lipid panel   Hemorrhoids    Stable without acute flare Chronic external recurrent hemorrhoids with some pain / bleeding. Uses Anusol suppository PRN, rarely - request printed refill today for future use if needed      Relevant Medications   aspirin EC 81 MG tablet   hydrocortisone (ANUSOL-HC) 25 MG suppository   GERD (gastroesophageal reflux disease)    Stable chronic GERD without complication. - No GI red flag symptoms. Exam is unremarkable with benign abdomen without pain today - Long-term PPI use - Last EGD 18 years ago, small hiatal hernia  Plan: 1. Continue Omeprazole 20mg  daily 30  min prior to 1st meal, discussed future taper plan, advised risk/benefits, long-term potential risks/side effects. Discussed would need to  gradually taper off due to rebound effect. However concern with family history esophageal cancer, unclear if PPI would lower his risk 2. Diet modifications reduce GERD 3. Follow-up 6 months - anticipate GI referral at that time to explore option of EGD and colonoscopy in 2019, for surveillance, consider future taper off PPI      Elevated liver enzymes    Stable chronic elevated LFTs, without clear etiology. Most likely fatty liver disease, no recent imaging - Not on statin therapy or provoking med - Consider future abdominal US for evaluation - Encourage healthy lifestyle improve exercise - Check fasting labs, lipids - Follow-up q 6 mo      Relevant Orders   COMPLETE METABOLIC PANEL WITH GFR   BPH (benign prostatic hyperplasia)    Stable chronic BPH with controlled LUTS, w/o obstruction - AUA BPH score 3-4 (mild, no prior score) - On Rapaflo (no prior meds) - Last PSA 0.5 (2017),  - Last DRE 2017, reported normal - No known personal/family history of prostate CA  Plan: 1. Continue Rapaflo 8mg  daily, not due for refill yet 2. Reviewed low AUA BPH score < 8, likely not covered for cialis for daily use BPH, consider future trial off Rapaflo may see if can try cialis in future if interested 3. Check PSA with labs 4. Follow-up as needed, future consider alternative Urology referral [facility] vs [facility] if needed      Relevant Orders   PSA, Total with Reflex to PSA, Free    Other Visit Diagnoses    Annual physical exam    -  Primary   Relevant Orders   COMPLETE METABOLIC PANEL WITH GFR   CBC with Differential/Platelet   Lipid panel   Hemoglobin A1c      Meds ordered this encounter  Medications  . Potassium 99 MG TABS    Sig: Take by mouth.  [name] aspirin EC 81 MG tablet    Sig: Take 81 mg by mouth daily.  . hydrocortisone (ANUSOL-HC) 25  MG suppository    Sig: Place 1 suppository (25 mg total) rectally 2 (two) times daily as needed.    Dispense:  6 suppository    Refill:  5      Follow up plan: Return in about 6 months (around 04/29/2017) for GERD, BPH, Pre-DM A1c.  [name], [facility] 10/27/2016, 5:45 PM

## 2016-10-27 NOTE — Assessment & Plan Note (Signed)
Stable chronic BPH with controlled LUTS, w/o obstruction - AUA BPH score 3-4 (mild, no prior score) - On Rapaflo (no prior meds) - Last PSA 0.5 (2017),  - Last DRE 2017, reported normal - No known personal/family history of prostate CA  Plan: 1. Continue Rapaflo 8mg  daily, not due for refill yet 2. Reviewed low AUA BPH score < 8, likely not covered for cialis for daily use BPH, consider future trial off Rapaflo may see if can try cialis in future if interested 3. Check PSA with labs 4. Follow-up as needed, future consider alternative Urology referral [facility] vs [facility] if needed

## 2016-10-27 NOTE — Assessment & Plan Note (Signed)
Average risk patient. Up to date on colonoscopy, last 2009, due 2019 next year - 1st colonoscopy few polyps, benign, advised 10 years - Will need new referral to [facility] in future for scheduling colonoscopy, considering doing EGD as well if needed for GERD, will defer decision to GI - Follow-up for now, no referral placed yet, return sooner if alarm symptoms

## 2016-10-27 NOTE — Assessment & Plan Note (Signed)
Stable without acute flare Chronic external recurrent hemorrhoids with some pain / bleeding. Uses Anusol suppository PRN, rarely - request printed refill today for future use if needed

## 2016-10-28 LAB — HEMOGLOBIN A1C
HEMOGLOBIN A1C: 5.5 % (ref <5.7)
MEAN PLASMA GLUCOSE: 111 mg/dL

## 2016-10-28 LAB — VITAMIN D 25 HYDROXY (VIT D DEFICIENCY, FRACTURES): VIT D 25 HYDROXY: 44 ng/mL (ref 30–100)

## 2016-10-28 LAB — PSA, TOTAL WITH REFLEX TO PSA, FREE: PSA, Total: 0.7 ng/mL (ref <=4.0)

## 2017-04-10 ENCOUNTER — Other Ambulatory Visit: Payer: Self-pay

## 2017-04-10 DIAGNOSIS — R351 Nocturia: Principal | ICD-10-CM

## 2017-04-10 DIAGNOSIS — N401 Enlarged prostate with lower urinary tract symptoms: Secondary | ICD-10-CM

## 2017-04-10 MED ORDER — SILODOSIN 8 MG PO CAPS
8.0000 mg | ORAL_CAPSULE | Freq: Every day | ORAL | 3 refills | Status: DC
Start: 2017-04-10 — End: 2017-10-20

## 2017-04-19 ENCOUNTER — Ambulatory Visit: Payer: BLUE CROSS/BLUE SHIELD | Admitting: Family Medicine

## 2017-04-21 ENCOUNTER — Ambulatory Visit: Payer: BLUE CROSS/BLUE SHIELD | Admitting: Family Medicine

## 2017-04-26 ENCOUNTER — Other Ambulatory Visit: Payer: Self-pay | Admitting: Family Medicine

## 2017-04-26 ENCOUNTER — Encounter: Payer: Self-pay | Admitting: Family Medicine

## 2017-04-26 ENCOUNTER — Ambulatory Visit (INDEPENDENT_AMBULATORY_CARE_PROVIDER_SITE_OTHER): Payer: BLUE CROSS/BLUE SHIELD | Admitting: Family Medicine

## 2017-04-26 VITALS — BP 116/74 | HR 76 | Temp 98.4°F | Resp 16 | Ht 69.0 in | Wt 207.8 lb

## 2017-04-26 DIAGNOSIS — E559 Vitamin D deficiency, unspecified: Secondary | ICD-10-CM

## 2017-04-26 DIAGNOSIS — R351 Nocturia: Secondary | ICD-10-CM

## 2017-04-26 DIAGNOSIS — Z23 Encounter for immunization: Secondary | ICD-10-CM

## 2017-04-26 DIAGNOSIS — N401 Enlarged prostate with lower urinary tract symptoms: Secondary | ICD-10-CM

## 2017-04-26 DIAGNOSIS — E782 Mixed hyperlipidemia: Secondary | ICD-10-CM

## 2017-04-26 DIAGNOSIS — R7303 Prediabetes: Secondary | ICD-10-CM

## 2017-04-26 DIAGNOSIS — K219 Gastro-esophageal reflux disease without esophagitis: Secondary | ICD-10-CM

## 2017-04-26 DIAGNOSIS — Z0000 Encounter for general adult medical examination without abnormal findings: Secondary | ICD-10-CM

## 2017-04-26 DIAGNOSIS — Z125 Encounter for screening for malignant neoplasm of prostate: Secondary | ICD-10-CM

## 2017-04-26 DIAGNOSIS — R748 Abnormal levels of other serum enzymes: Secondary | ICD-10-CM

## 2017-04-26 LAB — POCT GLYCOSYLATED HEMOGLOBIN (HGB A1C): HEMOGLOBIN A1C: 5.5 (ref ?–5.7)

## 2017-04-26 MED ORDER — OMEPRAZOLE 20 MG PO CPDR: 20.0000 mg | DELAYED_RELEASE_CAPSULE | Freq: Every day | ORAL | 3 refills | Status: DC

## 2017-04-26 NOTE — Assessment & Plan Note (Signed)
Well-controlled Pre-DM with A1c 5.5 (stable from 5.5, prior history >5.9 within past 1 year) Concern with obesity  Plan:  1. Not on any therapy currently - remain off 2. Encourage improved lifestyle - low carb, low sugar diet, reduce portion size, continue improving regular exercise especially in winter, may try to improve diet while less exercise 3. Follow-up 6 months - labs / A1c - if stable < 5.7 still can go to q 12 months instead of q 6 mo A1c

## 2017-04-26 NOTE — Assessment & Plan Note (Addendum)
Stable chronic GERD without complication. - No GI red flag symptoms - Long-term PPI use, in evening not AM, seems to control, has rare H2 blocker use for breakthrough - Last EGD 18 years ago, small hiatal hernia. Fam history of esophageal cancer.  Plan: 1. Refilled Omeprazole 20mg  daily - advised he may switch to AM dosing before 1st meal - he prefers to continue PM dosing. May continue OTC Zantac 75 or 150 PRN only, as long as rarely using 2. Diet modifications reduce GERD 3. Follow-up 6 months - patient agrees with plan and requesting [facility] referral at that time to explore option of EGD and colonoscopy in 2019, for surveillance, consider future taper off PPI

## 2017-04-26 NOTE — Progress Notes (Signed)
Subjective:    Patient ID: Jonathan Ortega, male    DOB: [birth year], 56 y.o.   MRN: [id number]  Jonathan Ortega is a 56 y.o. male presenting on 04/26/2017 for Gastroesophageal Reflux   HPI  FOLLOW-UP GERD: No new concerns. States if less weight, he has less problems or flare ups. Certain trigger foods. - Taking Omeprazole 20mg  daily PPI for many years, tolerating well, controlling GERD, takes in PM before meal, has not taken in AM. In past failed regular Zantac and Nexium. - He does take additional Zantac OTC 75 or 150mg  unsure dose PRN usually only when has symptoms x 1-2 doses then stops, rarely uses this - Has seen [facility] in past, had EGD about 18+ years ago, had a hiatal hernia in past. Mother's side with maternal uncle of family with esophageal cancer. He would like repeat screening.  FOLLOW-UP Pre-Diabetes - Reports prior history of elevated blood sugar and A1c up to >5.9 in past. Never dx with DM. Last reading 5.5, now today again 5.5. He is pleased with result, but does not recall range. - Never on medication Lifestyle: - Weight down 1-2 lbs some fluctuation - Diet: Still limiting sugar, not reducing portions as much, avoid some carbs. Still rarely drinks soda (< 2-3 per week), drinks unsweet tea, no sugar added to coffee, drinks about 1-2 bottles of water. - Exercise: Tries to walk about 4 mile (route) 2-3 times a week, less now in colder weather - No significant family history of DM Denies hypoglycemia  Health Maintenance: - Due for Flu Shot, will receive today  - See last visit, due for repeat colonoscopy screening in 2019, will request referral to [facility] for EGD and Colonoscopy at next visit annual in May 2019, he has family history of esophageal cancer  Depression screen [facility] 2/9 04/26/2017 10/27/2016  Decreased Interest 0 0  Down, Depressed, Hopeless 0 0  PHQ - 2 Score 0 0    Social History   Tobacco Use  . Smoking status: Never Smoker  . Smokeless tobacco: Never  Used  Substance Use Topics  . Alcohol use: Yes    Alcohol/week: 0.0 oz    Comment: have not had a drink since last month   . Drug use: No    Review of Systems Per HPI unless specifically indicated above     Objective:    BP 116/74   Pulse 76   Temp 98.4 F (36.9 C) (Oral)   Resp 16   Ht 5\' 9"  (1.753 m)   Wt 207 lb 12.8 oz (94.3 kg)   BMI 30.69 kg/m   Wt Readings from Last 3 Encounters:  04/26/17 207 lb 12.8 oz (94.3 kg)  10/27/16 209 lb (94.8 kg)  04/25/16 208 lb (94.3 kg)    Physical Exam  Constitutional: He is oriented to person, place, and time. He appears well-developed and well-nourished. No distress.  Well-appearing, comfortable, cooperative, overweight  HENT:  Head: Normocephalic and atraumatic.  Mouth/Throat: Oropharynx is clear and moist.  Eyes: Conjunctivae are normal. Right eye exhibits no discharge. Left eye exhibits no discharge.  Neck: Normal range of motion. Neck supple.  Cardiovascular: Normal rate, regular rhythm, normal heart sounds and intact distal pulses.  No murmur heard. Pulmonary/Chest: Effort normal and breath sounds normal. No respiratory distress. He has no wheezes. He has no rales.  Musculoskeletal: Normal range of motion. He exhibits no edema.  Neurological: He is alert and oriented to person, place, and time.  Skin:  Skin is warm and dry. No rash noted. He is not diaphoretic. No erythema.  Psychiatric: He has a normal mood and affect. His behavior is normal.  Well groomed, good eye contact, normal speech and thoughts  Nursing note and vitals reviewed.  Results for orders placed or performed in visit on 04/26/17  POCT HgB A1C  Result Value Ref Range   Hemoglobin A1C 5.5 5.7      Assessment & Plan:   Problem List Items Addressed This Visit    GERD (gastroesophageal reflux disease)    Stable chronic GERD without complication. - No GI red flag symptoms - Long-term PPI use, in evening not AM, seems to control, has rare H2 blocker use  for breakthrough - Last EGD 18 years ago, small hiatal hernia. Fam history of esophageal cancer.  Plan: 1. Refilled Omeprazole 20mg  daily - advised he may switch to AM dosing before 1st meal - he prefers to continue PM dosing. May continue OTC Zantac 75 or 150 PRN only, as long as rarely using 2. Diet modifications reduce GERD 3. Follow-up 6 months - patient agrees with plan and requesting [facility] referral at that time to explore option of EGD and colonoscopy in 2019, for surveillance, consider future taper off PPI      Relevant Medications   omeprazole (PRILOSEC) 20 MG capsule   Pre-diabetes - Primary    Well-controlled Pre-DM with A1c 5.5 (stable from 5.5, prior history >5.9 within past 1 year) Concern with obesity  Plan:  1. Not on any therapy currently - remain off 2. Encourage improved lifestyle - low carb, low sugar diet, reduce portion size, continue improving regular exercise especially in winter, may try to improve diet while less exercise 3. Follow-up 6 months - labs / A1c - if stable < 5.7 still can go to q 12 months instead of q 6 mo A1c      Relevant Orders   POCT HgB A1C (Completed)    Other Visit Diagnoses    Needs flu shot       Relevant Orders   Flu Vaccine QUAD 36+ mos IM (Completed)      Meds ordered this encounter  Medications  . omeprazole (PRILOSEC) 20 MG capsule    Sig: Take 1 capsule (20 mg total) by mouth daily.    Dispense:  90 capsule    Refill:  3    Follow up plan: Return in about 6 months (around 10/24/2017) for Annual Physical.  Future labs ordered for 6 months.  [name], [facility] 04/26/2017, 10:51 AM

## 2017-04-26 NOTE — Patient Instructions (Addendum)
Thank you for coming to the clinic today.  1. Refilled OMeprazole for 90 day supply with refills and already refilled Rapaflo on 04/10/17, 90 day with refills - call them to check status, let me know if any problems or have them reach out to me  2. Next visit will place referral to [facility] for Endoscopy and Colonoscopy for screening  3.  DUE for FASTING BLOOD WORK (no food or drink after midnight before the lab appointment, only water or coffee without cream/sugar on the morning of)  SCHEDULE "Lab Only" visit in the morning at the clinic for lab draw in 6 MONTHS   - Make sure Lab Only appointment is at about 1 week before your next appointment, so that results will be available  For Lab Results, once available within 2-3 days of blood draw, you can can log in to MyChart online to view your results and a brief explanation. Also, we can discuss results at next follow-up visit.   Please schedule a Follow-up Appointment to: Return in about 6 months (around 10/24/2017) for Annual Physical.  If you have any other questions or concerns, please feel free to call the clinic or send a message through [place]. You may also schedule an earlier appointment if necessary.  Additionally, you may be receiving a survey about your experience at our clinic within a few days to 1 week by e-mail or mail. We value your feedback.  [name], DO [place]

## 2017-10-13 ENCOUNTER — Other Ambulatory Visit: Payer: BLUE CROSS/BLUE SHIELD

## 2017-10-13 DIAGNOSIS — E782 Mixed hyperlipidemia: Secondary | ICD-10-CM

## 2017-10-13 DIAGNOSIS — R748 Abnormal levels of other serum enzymes: Secondary | ICD-10-CM

## 2017-10-13 DIAGNOSIS — Z125 Encounter for screening for malignant neoplasm of prostate: Secondary | ICD-10-CM

## 2017-10-13 DIAGNOSIS — Z0000 Encounter for general adult medical examination without abnormal findings: Secondary | ICD-10-CM

## 2017-10-13 DIAGNOSIS — R7303 Prediabetes: Secondary | ICD-10-CM

## 2017-10-16 LAB — COMPLETE METABOLIC PANEL WITH GFR
AG RATIO: 1.9 (calc) (ref 1.0–2.5)
ALBUMIN MSPROF: 4.3 g/dL (ref 3.6–5.1)
ALKALINE PHOSPHATASE (APISO): 69 U/L (ref 40–115)
ALT: 37 U/L (ref 9–46)
AST: 25 U/L (ref 10–35)
BILIRUBIN TOTAL: 1.3 mg/dL — AB (ref 0.2–1.2)
BUN: 11 mg/dL (ref 7–25)
CHLORIDE: 104 mmol/L (ref 98–110)
CO2: 29 mmol/L (ref 20–32)
Calcium: 9.1 mg/dL (ref 8.6–10.3)
Creat: 1.03 mg/dL (ref 0.70–1.33)
GFR, EST AFRICAN AMERICAN: 94 mL/min/{1.73_m2} (ref >=60)
GFR, Est Non African American: 81 mL/min/{1.73_m2} (ref >=60)
GLOBULIN: 2.3 g/dL (ref 1.9–3.7)
Glucose, Bld: 98 mg/dL (ref 65–99)
Potassium: 4.1 mmol/L (ref 3.5–5.3)
SODIUM: 141 mmol/L (ref 135–146)
Total Protein: 6.6 g/dL (ref 6.1–8.1)

## 2017-10-16 LAB — CBC WITH DIFFERENTIAL/PLATELET
BASOS ABS: 50 {cells}/uL (ref 0–200)
Basophils Relative: 0.7 %
Eosinophils Absolute: 382 cells/uL (ref 15–500)
Eosinophils Relative: 5.3 %
HEMATOCRIT: 46.8 % (ref 38.5–50.0)
Hemoglobin: 16.3 g/dL (ref 13.2–17.1)
LYMPHS ABS: 1642 {cells}/uL (ref 850–3900)
MCH: 32.2 pg (ref 27.0–33.0)
MCHC: 34.8 g/dL (ref 32.0–36.0)
MCV: 92.5 fL (ref 80.0–100.0)
MPV: 10.9 fL (ref 7.5–12.5)
Monocytes Relative: 10.3 %
NEUTROS PCT: 60.9 %
Neutro Abs: 4385 cells/uL (ref 1500–7800)
Platelets: 171 10*3/uL (ref 140–400)
RBC: 5.06 10*6/uL (ref 4.20–5.80)
RDW: 12.1 % (ref 11.0–15.0)
TOTAL LYMPHOCYTE: 22.8 %
WBC: 7.2 10*3/uL (ref 3.8–10.8)
WBCMIX: 742 {cells}/uL (ref 200–950)

## 2017-10-16 LAB — LIPID PANEL
CHOLESTEROL: 175 mg/dL (ref <200)
HDL: 35 mg/dL — AB (ref >40)
LDL CHOLESTEROL (CALC): 115 mg/dL — AB
Non-HDL Cholesterol (Calc): 140 mg/dL (calc) — ABNORMAL HIGH (ref <130)
TRIGLYCERIDES: 134 mg/dL (ref <150)
Total CHOL/HDL Ratio: 5 (calc) — ABNORMAL HIGH (ref <5.0)

## 2017-10-16 LAB — HEMOGLOBIN A1C
EAG (MMOL/L): 6.2 (calc)
Hgb A1c MFr Bld: 5.5 % of total Hgb (ref <5.7)
Mean Plasma Glucose: 111 (calc)

## 2017-10-16 LAB — PSA, TOTAL WITH REFLEX TO PSA, FREE: PSA, Total: 0.6 ng/mL (ref <=4.0)

## 2017-10-20 ENCOUNTER — Other Ambulatory Visit: Payer: Self-pay | Admitting: Family Medicine

## 2017-10-20 ENCOUNTER — Ambulatory Visit (INDEPENDENT_AMBULATORY_CARE_PROVIDER_SITE_OTHER): Payer: BLUE CROSS/BLUE SHIELD | Admitting: Family Medicine

## 2017-10-20 ENCOUNTER — Encounter: Payer: Self-pay | Admitting: Family Medicine

## 2017-10-20 VITALS — BP 124/84 | HR 73 | Temp 98.2°F | Resp 16 | Ht 69.0 in | Wt 208.0 lb

## 2017-10-20 DIAGNOSIS — E782 Mixed hyperlipidemia: Secondary | ICD-10-CM

## 2017-10-20 DIAGNOSIS — N401 Enlarged prostate with lower urinary tract symptoms: Secondary | ICD-10-CM

## 2017-10-20 DIAGNOSIS — R748 Abnormal levels of other serum enzymes: Secondary | ICD-10-CM | POA: Diagnosis not present

## 2017-10-20 DIAGNOSIS — Z0000 Encounter for general adult medical examination without abnormal findings: Secondary | ICD-10-CM

## 2017-10-20 DIAGNOSIS — R7303 Prediabetes: Secondary | ICD-10-CM

## 2017-10-20 DIAGNOSIS — R351 Nocturia: Secondary | ICD-10-CM

## 2017-10-20 DIAGNOSIS — Z125 Encounter for screening for malignant neoplasm of prostate: Secondary | ICD-10-CM

## 2017-10-20 DIAGNOSIS — K219 Gastro-esophageal reflux disease without esophagitis: Secondary | ICD-10-CM | POA: Diagnosis not present

## 2017-10-20 DIAGNOSIS — Z1211 Encounter for screening for malignant neoplasm of colon: Secondary | ICD-10-CM

## 2017-10-20 MED ORDER — SILODOSIN 8 MG PO CAPS: 8.0000 mg | ORAL_CAPSULE | Freq: Every day | ORAL | 3 refills | Status: DC

## 2017-10-20 MED ORDER — OMEPRAZOLE 20 MG PO CPDR: 20.0000 mg | DELAYED_RELEASE_CAPSULE | Freq: Every day | ORAL | 3 refills | Status: DC

## 2017-10-20 NOTE — Assessment & Plan Note (Signed)
Stable chronic BPH with controlled LUTS, w/o obstruction - AUA BPH score 5-6 now (slight increase from prior 3-4) - due to change brand Rapaflo to generic Silodosin - Last PSA 0.6 (10/2017) - prior 0.5 to 0.7 - Last DRE 2017, reported normal - No known personal/family history of prostate CA  Plan: 1. Continue generic Silodosin 8mg  daily, sent refills 2. Reviewed low AUA BPH score < 8, likely not covered for cialis for daily use BPH, consider future trial off Rapaflo may see if can try cialis in future if interested 3 Yearly PSA 4. Follow-up as needed, future consider alternative Urology referral [facility] vs [facility] if needed

## 2017-10-20 NOTE — Assessment & Plan Note (Signed)
Referral to [facility] as new patient for routine colonoscopy screening, last done 2009 by Dr Gustavo Lah at [facility], now he wants to switch to [facility] office, he was told had 1 polyp but benign on last report. He also had prior Upper Endoscopy in past with history of GERD, he is requesting to discuss if needs endoscopy as well as colonoscopy

## 2017-10-20 NOTE — Assessment & Plan Note (Addendum)
Well-controlled Pre-DM with A1c 5.5 Concern with obesity  Plan:  1. Not on any therapy currently - remain off 2. Encourage improved lifestyle - low carb, low sugar diet, reduce portion size, continue improving regular exercise 3. Follow-up 1 year for annual + A1c

## 2017-10-20 NOTE — Assessment & Plan Note (Signed)
Mild abnormal cholesterol not on statin Last lipid panel 10/2017 Calculated ASCVD 10 yr risk score 6.3%  Plan: 1. Discussion of ASCVD risk reduction statin vs ASA and review guidelines on ASA use 2. Ultimately agree to continue ASA 81mg  for primary ASCVD risk reduction now at age 57 with low bleeding risk, may continue into age 52s - possibly switch to Statin in future 3. Encourage improved lifestyle - low carb/cholesterol, reduce portion size, continue improving regular exercise 4. Follow-up yearly lipid

## 2017-10-20 NOTE — Assessment & Plan Note (Signed)
Resolved Preivuosly stable chronic problem mild elevated LFTs Likely fatty liver Still mild elevated T Bili 1.3 Future consider abdominal US for liver eval and monitor labs yearly

## 2017-10-20 NOTE — Progress Notes (Signed)
Subjective:    Patient ID: Jonathan Ortega, male    DOB: 1960/10/29, 57 y.o.   MRN: [id number]  Jonathan Ortega is a 57 y.o. male presenting on 10/20/2017 for Annual Exam   HPI   Here for Annual Physical and Lab Review.  FOLLOW-UP GERD: No new concerns - similar to last visit. States if less weight, he has less problems or flare ups. Certain trigger foods. - Taking Omeprazole 20mg  daily PPI for many years, tolerating well, controlling GERD, takes in PM before meal, has not taken in AM. In past failed regular Zantac and Nexium. - He does take additional Zantac OTC 75 or 150mg  unsure dose PRN usually only when has symptoms x 1-2 doses then stops, rarely uses this - Has seen [facility] in past, had EGD about 18+ years ago, had a hiatal hernia in past. Mother's side with maternal uncle of family with esophageal cancer. He would like repeat screening.  Pre-Diabetes Last A1c 5.5, stable from previous readings. - Never on medication Lifestyle: - Weight down 1-2 lbs some fluctuation - relatively stable - Diet: Still limiting sugar, not reducing portions as much, avoid some carbs. Still rarely drinks soda (< 2-3 per week), drinks unsweet tea, no sugar added to coffee, drinks about 1-2 bottles of water. - Exercise: Tries to walk about 4 mile (route) 2-3 times a week, less now in colder weather - No significant family history of DM Denies hypoglycemia  Elevated BP without dx HTN Reports not checking BP outside office recently. Previously controlled, usually improve on re-check Current Meds - None    HYPERLIPIDEMIA: - Reports no concerns. Last lipid panel 10/2017, with mild low HDL and elevated LDL - Taking ASA 81mg  daily for primary prevention. No known CAD / MI / CVA - Not taking any medicines OTC or rx for cholesterol. Never on statin.  Elevated LFTs / Elevated Bilirubin - Reports chronic history of elevated LFTs, has been told this for >20 years by his report, no prior ultrasound or other  imaging test to diagnose this. - Today has no new concerns, labs are improved, LFTs resolved normal and bilirubin only mild elevated  BPH with LUTS, controlled - Reports chronic problem for >5 years. Taking Silodosin 8mg  daily good results, this was recently changed from brand name Rapaflo to generic Silodosin, he states the generic is not as effective but it is very similar, cost difference is >$150 he prefers to keep generic - No longer follows up with [facility] Dr Yves Dill  NEW SCORE 10/20/17 on Silodosin generic (compared to 10/2016 - on brand Rapaflo) AUA BPH Symptom Score over past 1 month 1. Sensation of not emptying bladder post void - 0 2. Urinate less than 2 hour after finish last void - 1 (worse if drinks more fluids) 3. Start/Stop several times during void - 0 4. Difficult to postpone urination - 2 (inc urgency, on Silodosin generic 8mg ) / (Rapaflo 8mg  brand - 1) 5. Weak urinary stream - 2 (towards end of void) / (Rapaflo 8mg  brand - 1) 6. Push or strain urination - 0 7. Nocturia - 1-2 (on generic) times (work schedule is different harder to day)  Total Score: 5-6 (Mild BPH symptoms) on generic Silodosin / 3-4 on brand name Rapaflo  Additional question - He has copy of dental x-rays today, was advised to ask his primary doctor about some "spots" on x-ray, he is not sure what they are and was not told what to ask about. Full  Dental - [place] [facility]  - Prostate cancer screening: No known personal or family history, last PSA 0.6 (10/2017 - previously 0.5 to 0.7), last DRE reported prostate was only mildly enlarged, see above BPH LUTS  - Colonoscopy last done 11/2007 (Dr Loistine Simas, [facility]), normal only benign polyps, good for 10 years. Last endoscopy done 2001 by his report - request repeat Colonoscopy now, prefer local [facility].  he has family history of esophageal cancer   Depression screen [facility] 2/9 10/20/2017 04/26/2017 10/27/2016    Decreased Interest 0 0 0  Down, Depressed, Hopeless 0 0 0  PHQ - 2 Score 0 0 0    Past Medical History:  Diagnosis Date  . Allergy   . GERD (gastroesophageal reflux disease)    History reviewed. No pertinent surgical history. Social History   Socioeconomic History  . Marital status: Single    Spouse name: Not on file  . Number of children: Not on file  . Years of education: Not on file  . Highest education level: Not on file  Occupational History  . Not on file  Social Needs  . Financial resource strain: Not on file  . Food insecurity:    Worry: Not on file    Inability: Not on file  . Transportation needs:    Medical: Not on file    Non-medical: Not on file  Tobacco Use  . Smoking status: Never Smoker  . Smokeless tobacco: Never Used  Substance and Sexual Activity  . Alcohol use: Yes    Alcohol/week: 0.0 oz    Comment: have not had a drink since last month   . Drug use: No  . Sexual activity: Not on file  Lifestyle  . Physical activity:    Days per week: Not on file    Minutes per session: Not on file  . Stress: Not on file  Relationships  . Social connections:    Talks on phone: Not on file    Gets together: Not on file    Attends religious service: Not on file    Active member of club or organization: Not on file    Attends meetings of clubs or organizations: Not on file    Relationship status: Not on file  . Intimate partner violence:    Fear of current or ex partner: Not on file    Emotionally abused: Not on file    Physically abused: Not on file    Forced sexual activity: Not on file  Other Topics Concern  . Not on file  Social History Narrative  . Not on file   Family History  Problem Relation Age of Onset  . Hypertension Paternal Uncle   . Hypertension Maternal Grandmother   . Ovarian cancer Mother   . Esophageal cancer Maternal Uncle    Current Outpatient Medications on File Prior to Visit  Medication Sig  . aspirin EC 81 MG tablet Take  81 mg by mouth daily.  . cholecalciferol (VITAMIN D) 1000 UNITS tablet Take 1,000 Units by mouth daily.  [name] loratadine (CLARITIN) 10 MG tablet Take 10 mg by mouth daily.  . Potassium 99 MG TABS Take by mouth.  . hydrocortisone (ANUSOL-HC) 25 MG suppository Place 1 suppository (25 mg total) rectally 2 (two) times daily as needed. (Patient not taking: Reported on 10/20/2017)   No current facility-administered medications on file prior to visit.     Review of Systems  Constitutional: Negative for activity change, appetite  change, chills, diaphoresis, fatigue and fever.  HENT: Negative for congestion and hearing loss.   Eyes: Negative for visual disturbance.  Respiratory: Negative for apnea, cough, choking, chest tightness, shortness of breath and wheezing.   Cardiovascular: Negative for chest pain, palpitations and leg swelling.  Gastrointestinal: Negative for abdominal pain, constipation, diarrhea, nausea and vomiting.  Endocrine: Negative for cold intolerance.  Genitourinary: Negative for decreased urine volume, difficulty urinating, dysuria, frequency, hematuria, testicular pain and urgency.       See HPI for mild LUTS  Musculoskeletal: Negative for arthralgias and neck pain.  Skin: Negative for rash.  Allergic/Immunologic: Negative for environmental allergies.  Neurological: Negative for dizziness, weakness, light-headedness, numbness and headaches.  Hematological: Negative for adenopathy.  Psychiatric/Behavioral: Negative for behavioral problems, dysphoric mood and sleep disturbance.   Per HPI unless specifically indicated above     Objective:    BP 124/84 (BP Location: Left Arm, Cuff Size: Normal)   Pulse 73   Temp 98.2 F (36.8 C) (Oral)   Resp 16   Ht 5\' 9"  (1.753 m)   Wt 208 lb (94.3 kg)   BMI 30.72 kg/m   Wt Readings from Last 3 Encounters:  10/20/17 208 lb (94.3 kg)  04/26/17 207 lb 12.8 oz (94.3 kg)  10/27/16 209 lb (94.8 kg)    Physical Exam  Constitutional: He  is oriented to person, place, and time. He appears well-developed and well-nourished. No distress.  Well-appearing, comfortable, cooperative, overweight  HENT:  Head: Normocephalic and atraumatic.  Mouth/Throat: Oropharynx is clear and moist.  Frontal / maxillary sinuses non-tender. Nares patent without purulence or edema. Bilateral TMs clear without erythema, effusion or bulging. Oropharynx clear without erythema, exudates, edema or asymmetry.  Eyes: Pupils are equal, round, and reactive to light. Conjunctivae and EOM are normal. Right eye exhibits no discharge. Left eye exhibits no discharge.  Neck: Normal range of motion. Neck supple. No thyromegaly present.  Cardiovascular: Normal rate, regular rhythm, normal heart sounds and intact distal pulses.  No murmur heard. Pulmonary/Chest: Effort normal and breath sounds normal. No respiratory distress. He has no wheezes. He has no rales.  Abdominal: Soft. Bowel sounds are normal. He exhibits no distension and no mass. There is no tenderness.  Musculoskeletal: Normal range of motion. He exhibits no edema or tenderness.  Upper / Lower Extremities: - Normal muscle tone, strength bilateral upper extremities 5/5, lower extremities 5/5  Lymphadenopathy:    He has no cervical adenopathy.  Neurological: He is alert and oriented to person, place, and time.  Distal sensation intact to light touch all extremities  Skin: Skin is warm and dry. No rash noted. He is not diaphoretic. No erythema.  Psychiatric: He has a normal mood and affect. His behavior is normal.  Well groomed, good eye contact, normal speech and thoughts  Nursing note and vitals reviewed.  Results for orders placed or performed in visit on 10/13/17  PSA, Total with Reflex to PSA, Free  Result Value Ref Range   PSA, Total 0.6 < OR = 4.0 ng/mL  CBC with Differential/Platelet  Result Value Ref Range   WBC 7.2 3.8 - 10.8 Thousand/uL   RBC 5.06 4.20 - 5.80 Million/uL   Hemoglobin 16.3  13.2 - 17.1 g/dL   HCT 46.8 38.5 - 50.0 %   MCV 92.5 80.0 - 100.0 fL   MCH 32.2 27.0 - 33.0 pg   MCHC 34.8 32.0 - 36.0 g/dL   RDW 12.1 11.0 - 15.0 %   Platelets 171 140 -  400 Thousand/uL   MPV 10.9 7.5 - 12.5 fL   Neutro Abs 4,385 1,500 - 7,800 cells/uL   Lymphs Abs 1,642 850 - 3,900 cells/uL   WBC mixed population 742 200 - 950 cells/uL   Eosinophils Absolute 382 15 - 500 cells/uL   Basophils Absolute 50 0 - 200 cells/uL   Neutrophils Relative % 60.9 %   Total Lymphocyte 22.8 %   Monocytes Relative 10.3 %   Eosinophils Relative 5.3 %   Basophils Relative 0.7 %  Hemoglobin A1c  Result Value Ref Range   Hgb A1c MFr Bld 5.5 <5.7 % of total Hgb   Mean Plasma Glucose 111 (calc)   eAG (mmol/L) 6.2 (calc)  Lipid panel  Result Value Ref Range   Cholesterol 175 <200 mg/dL   HDL 35 (L) >40 mg/dL   Triglycerides 134 <150 mg/dL   LDL Cholesterol (Calc) 115 (H) mg/dL (calc)   Total CHOL/HDL Ratio 5.0 (H) <5.0 (calc)   Non-HDL Cholesterol (Calc) 140 (H) <130 mg/dL (calc)  COMPLETE METABOLIC PANEL WITH GFR  Result Value Ref Range   Glucose, Bld 98 65 - 99 mg/dL   BUN 11 7 - 25 mg/dL   Creat 1.03 0.70 - 1.33 mg/dL   GFR, Est Non African American 81 > OR = 60 mL/min/1.13m2   GFR, Est African American 94 > OR = 60 mL/min/1.72m2   BUN/Creatinine Ratio NOT APPLICABLE 6 - 22 (calc)   Sodium 141 135 - 146 mmol/L   Potassium 4.1 3.5 - 5.3 mmol/L   Chloride 104 98 - 110 mmol/L   CO2 29 20 - 32 mmol/L   Calcium 9.1 8.6 - 10.3 mg/dL   Total Protein 6.6 6.1 - 8.1 g/dL   Albumin 4.3 3.6 - 5.1 g/dL   Globulin 2.3 1.9 - 3.7 g/dL (calc)   AG Ratio 1.9 1.0 - 2.5 (calc)   Total Bilirubin 1.3 (H) 0.2 - 1.2 mg/dL   Alkaline phosphatase (APISO) 69 40 - 115 U/L   AST 25 10 - 35 U/L   ALT 37 9 - 46 U/L      Assessment & Plan:   Problem List Items Addressed This Visit    BPH (benign prostatic hyperplasia)    Stable chronic BPH with controlled LUTS, w/o obstruction - AUA BPH score 5-6 now (slight  increase from prior 3-4) - due to change brand Rapaflo to generic Silodosin - Last PSA 0.6 (10/2017) - prior 0.5 to 0.7 - Last DRE 2017, reported normal - No known personal/family history of prostate CA  Plan: 1. Continue generic Silodosin 8mg  daily, sent refills 2. Reviewed low AUA BPH score < 8, likely not covered for cialis for daily use BPH, consider future trial off Rapaflo may see if can try cialis in future if interested 3 Yearly PSA 4. Follow-up as needed, future consider alternative Urology referral [facility] vs [facility] if needed      Relevant Medications   silodosin (RAPAFLO) 8 MG CAPS capsule   Elevated liver enzymes    Resolved Preivuosly stable chronic problem mild elevated LFTs Likely fatty liver Still mild elevated T Bili 1.3 Future consider abdominal US for liver eval and monitor labs yearly      GERD (gastroesophageal reflux disease)    Stable chronic GERD without complication. - No GI red flag symptoms - Long-term PPI use, in evening not AM, seems to control, has rare H2 blocker use for breakthrough - Last EGD 18 years ago, small hiatal hernia. Fam history of esophageal  cancer.  Plan: 1. Continue Omeprazole 20mg  daily May continue OTC Zantac 75 or 150 PRN only, as long as rarely using 2. Diet modifications reduce GERD 3. Follow-up 6-12 months - already refer today to [facility] for eval of possible repeat endoscopy      Relevant Medications   omeprazole (PRILOSEC) 20 MG capsule   Other Relevant Orders   Ambulatory referral to Gastroenterology   Hyperlipidemia    Mild abnormal cholesterol not on statin Last lipid panel 10/2017 Calculated ASCVD 10 yr risk score 6.3%  Plan: 1. Discussion of ASCVD risk reduction statin vs ASA and review guidelines on ASA use 2. Ultimately agree to continue ASA 81mg  for primary ASCVD risk reduction now at age 50 with low bleeding risk, may continue into age 49s - possibly switch to Statin in future 3. Encourage improved lifestyle  - low carb/cholesterol, reduce portion size, continue improving regular exercise 4. Follow-up yearly lipid      Pre-diabetes    Well-controlled Pre-DM with A1c 5.5 Concern with obesity  Plan:  1. Not on any therapy currently - remain off 2. Encourage improved lifestyle - low carb, low sugar diet, reduce portion size, continue improving regular exercise 3. Follow-up 1 year for annual + A1c      Screening for colon cancer    Referral to [place] as new patient for routine colonoscopy screening, last done 2009 by Dr Gustavo Lah at [facility], now he wants to switch to [place] office, he was told had 1 polyp but benign on last report. He also had prior Upper Endoscopy in past with history of GERD, he is requesting to discuss if needs endoscopy as well as colonoscopy      Relevant Orders   Ambulatory referral to Gastroenterology    Other Visit Diagnoses    Annual physical exam    -  Primary    Up to date health maintenance Refer to GI for colonoscopy Review lab results Encourage improve lifestyle diet and exercise    Meds ordered this encounter  Medications  . silodosin (RAPAFLO) 8 MG CAPS capsule    Sig: Take 1 capsule (8 mg total) by mouth daily with breakfast.    Dispense:  90 capsule    Refill:  3    Add refills  . omeprazole (PRILOSEC) 20 MG capsule    Sig: Take 1 capsule (20 mg total) by mouth daily.    Dispense:  90 capsule    Refill:  3    Add refills   Orders Placed This Encounter  Procedures  . Ambulatory referral to Gastroenterology    Referral Priority:   Routine    Referral Type:   Consultation    Referral Reason:   Specialty Services Required    Number of Visits Requested:   1     Follow up plan: Return in about 1 year (around 10/21/2018) for Annual Physical.  Future labs ordered for 10/17/18  [name], [facility] 10/20/2017, 3:42 PM

## 2017-10-20 NOTE — Patient Instructions (Addendum)
Thank you for coming to the office today.  Continue Silodosin generic - refilled - BPH seems controlled  Dental X-rays - to be determined, I will call your dentist and get back to you  Referred for Colonoscopy last time 2009 by Dr Angelica Chessman at [facility] - now switching to [place]  Also discuss Upper Endoscopy if need for acid reflux  Cholesterol overall is okay seems controlled  Recommend to continue Aspirin daily for now - in future we may switch to cholesterol medicine  Keep track of BP 1-2 x monthly goal < 140 / 90  Sugar is stable   GASTROENTEROLOGY (GI)  [facility]) [address] [place], [place] [address] Phone: [phone]  DUE for [place] (no food or drink after midnight before the lab appointment, only water or coffee without cream/sugar on the morning of)  SCHEDULE "Lab Only" visit in the morning at the clinic for lab draw in 1 YEAR  - Make sure Lab Only appointment is at about 1 week before your next appointment, so that results will be available  For Lab Results, once available within 2-3 days of blood draw, you can can log in to MyChart online to view your results and a brief explanation. Also, we can discuss results at next follow-up visit.   Please schedule a Follow-up Appointment to: Return in about 1 year (around 10/21/2018) for Annual Physical.  If you have any other questions or concerns, please feel free to call the office or send a message through [place]. You may also schedule an earlier appointment if necessary.  Additionally, you may be receiving a survey about your experience at our office within a few days to 1 week by e-mail or mail. We value your feedback.  [name], DO [place]

## 2017-10-20 NOTE — Assessment & Plan Note (Signed)
Stable chronic GERD without complication. - No GI red flag symptoms - Long-term PPI use, in evening not AM, seems to control, has rare H2 blocker use for breakthrough - Last EGD 18 years ago, small hiatal hernia. Fam history of esophageal cancer.  Plan: 1. Continue Omeprazole 20mg  daily May continue OTC Zantac 75 or 150 PRN only, as long as rarely using 2. Diet modifications reduce GERD 3. Follow-up 6-12 months - already refer today to [facility] for eval of possible repeat endoscopy

## 2017-10-23 ENCOUNTER — Telehealth: Payer: Self-pay | Admitting: Family Medicine

## 2017-10-23 NOTE — Telephone Encounter (Signed)
Last seen 10/20/17 for annual physical, he had copy of dental x-rays with him, see note for details, and he said there was an area of calcification concern R sided - I am not familiar reading these and he was not clear on what the possible problem was, I called his dentist Dr Lavone Neri at [facility] today and reviewed with her personally she said that the calcification was likely parotid gland area or other, behind R mandible, and NOT in area of carotid or blood vessel, unlikely to be atherosclerosis. She will monitor this in future with x-ray and follow-up with [place] as needed. I called patient back to clarify the concern and reviewed this with him, no further intervention or change, if he were to get significant pain swelling or other new changes in R jaw area can return to dentist or [place]  [name], [facility] 10/23/2017, 12:03 PM

## 2017-12-11 ENCOUNTER — Ambulatory Visit (INDEPENDENT_AMBULATORY_CARE_PROVIDER_SITE_OTHER): Payer: BLUE CROSS/BLUE SHIELD | Admitting: Gastroenterology

## 2017-12-11 ENCOUNTER — Encounter: Payer: Self-pay | Admitting: Gastroenterology

## 2017-12-11 ENCOUNTER — Telehealth: Payer: Self-pay | Admitting: Gastroenterology

## 2017-12-11 VITALS — BP 129/83 | HR 71 | Ht 69.0 in | Wt 210.6 lb

## 2017-12-11 DIAGNOSIS — K760 Fatty (change of) liver, not elsewhere classified: Secondary | ICD-10-CM

## 2017-12-11 DIAGNOSIS — R748 Abnormal levels of other serum enzymes: Secondary | ICD-10-CM

## 2017-12-11 DIAGNOSIS — K219 Gastro-esophageal reflux disease without esophagitis: Secondary | ICD-10-CM | POA: Diagnosis not present

## 2017-12-11 DIAGNOSIS — Z1211 Encounter for screening for malignant neoplasm of colon: Secondary | ICD-10-CM | POA: Diagnosis not present

## 2017-12-11 MED ORDER — BISACODYL 5 MG PO TBEC: 10.0000 mg | DELAYED_RELEASE_TABLET | Freq: Once | ORAL | 0 refills | Status: AC

## 2017-12-11 MED ORDER — PEG 3350-KCL-NA BICARB-NACL 420 G PO SOLR: 4000.0000 mL | Freq: Once | ORAL | 0 refills | Status: AC

## 2017-12-11 NOTE — Addendum Note (Signed)
Addended by: [name] on: 12/11/2017 11:46 AM   Modules accepted: Orders

## 2017-12-11 NOTE — Telephone Encounter (Signed)
Pt is calling to see where Jonathan Ortega called in the Lab order he wanted [facility] but I informed him that if they will not do them we would call them into the [place] [facility] he wants to know who they are through?? Please call pt

## 2017-12-11 NOTE — Addendum Note (Signed)
Addended by: Earl Lagos on: 12/11/2017 05:19 PM   Modules accepted: Orders

## 2017-12-11 NOTE — Addendum Note (Signed)
Addended by: [name] on: 12/11/2017 11:28 AM   Modules accepted: Orders

## 2017-12-11 NOTE — Progress Notes (Addendum)
Jonathan Ortega [address]  [place]  [place], [place] [address]  Main: [phone]  Fax: [phone]   Gastroenterology Consultation  Referring Provider:     Nobie Ortega * Primary Care Physician:  Jonathan Hauser, DO Primary Gastroenterologist:  Dr. Vonda Ortega Reason for Consultation:     GERD, screening colonoscopy        HPI:   Chief complaint: Heartburn  Jonathan Ortega is a 57 y.o. y/o male referred for consultation & management  by Dr. Parks Ranger, Jonathan Doughty, DO.  Patient reports chronic history of acid reflux for which he takes Prilosec 20 mg once daily.  He takes it in the evening, and it controls his symptoms well.  He reports breakthrough symptoms once or twice a month at most.  Usually breakthrough symptoms occur at night, where he wakes up with a burning sensation in his throat of her chest.  Takes antacids or Zantac as needed during these episodes which helps his symptoms.  States when he misses his Prilosec, he feels heartburn, and when he has tried being off the Prilosec symptoms return and does it has been continued for years.  No dysphagia.  No weight loss.  No abdominal pain.  No altered bowel habits, melena, hematochezia, nausea or vomiting.  Reports previous history of an EGD years ago by [facility], results not available.  Reports last screening colonoscopy was 10 years ago as well, report unavailable.  No family history of colon cancer.  Past Medical History:  Diagnosis Date  . Allergy   . GERD (gastroesophageal reflux disease)     No past surgical history on file.  Prior to Admission medications   Medication Sig Start Date End Date Taking? Authorizing Provider  aspirin EC 81 MG tablet Take 81 mg by mouth daily.    [provider]  cholecalciferol (VITAMIN D) 1000 UNITS tablet Take 1,000 Units by mouth daily.    [provider]  hydrocortisone (ANUSOL-HC) 25 MG suppository Place 1 suppository  (25 mg total) rectally 2 (two) times daily as needed. Patient not taking: Reported on 10/20/2017 10/27/16   Jonathan Hauser, DO  loratadine (CLARITIN) 10 MG tablet Take 10 mg by mouth daily.    [provider]  omeprazole (PRILOSEC) 20 MG capsule Take 1 capsule (20 mg total) by mouth daily. 10/20/17   Jonathan Ortega, Jonathan Doughty, DO  Potassium 99 MG TABS Take by mouth.    [provider]  silodosin (RAPAFLO) 8 MG CAPS capsule Take 1 capsule (8 mg total) by mouth daily with breakfast. 10/20/17   Jonathan Hauser, DO    Family History  Problem Relation Age of Onset  . Hypertension Paternal Uncle   . Hypertension Maternal Grandmother   . Ovarian cancer Mother   . Esophageal cancer Maternal Uncle      Social History   Tobacco Use  . Smoking status: Never Smoker  . Smokeless tobacco: Never Used  Substance Use Topics  . Alcohol use: Yes    Alcohol/week: 0.0 oz    Comment: have not had a drink since last month   . Drug use: No    Allergies as of 12/11/2017  . (No Known Allergies)    Review of Systems:    All systems reviewed and negative except where noted in HPI.   Physical Exam:  There were no vitals taken for this visit. No LMP for male patient. Psych:  Alert and cooperative. Normal mood and affect. General:  Alert,  Well-developed, well-nourished, pleasant and cooperative in NAD Head:  Normocephalic and atraumatic. Eyes:  Sclera clear, no icterus.   Conjunctiva pink. Ears:  Normal auditory acuity. Nose:  No deformity, discharge, or lesions. Mouth:  No deformity or lesions,oropharynx pink & moist. Neck:  Supple; no masses or thyromegaly. Lungs:  Respirations even and unlabored.  Clear throughout to auscultation.   No wheezes, crackles, or rhonchi. No acute distress. Heart:  Regular rate and rhythm; no murmurs, clicks, rubs, or gallops. Abdomen:  Normal bowel sounds.  No bruits.  Soft, non-tender and non-distended without masses,  hepatosplenomegaly or hernias noted.  No guarding or rebound tenderness.    Msk:  Symmetrical without gross deformities. Good, equal movement & strength bilaterally. Pulses:  Normal pulses noted. Extremities:  No clubbing or edema.  No cyanosis. Neurologic:  Alert and oriented x3;  grossly normal neurologically. Skin:  Intact without significant lesions or rashes. No jaundice. Lymph Nodes:  No significant cervical adenopathy. Psych:  Alert and cooperative. Normal mood and affect.   Labs: CBC    Component Value Date/Time   WBC 7.2 10/13/2017 0830   RBC 5.06 10/13/2017 0830   HGB 16.3 10/13/2017 0830   HGB 16.3 04/21/2015 1708   HCT 46.8 10/13/2017 0830   HCT 48.6 04/21/2015 1708   PLT 171 10/13/2017 0830   PLT 158 04/21/2015 1708   MCV 92.5 10/13/2017 0830   MCV 94 04/21/2015 1708   MCH 32.2 10/13/2017 0830   MCHC 34.8 10/13/2017 0830   RDW 12.1 10/13/2017 0830   RDW 13.0 04/21/2015 1708   LYMPHSABS 1,642 10/13/2017 0830   LYMPHSABS 1.7 04/21/2015 1708   MONOABS 792 10/27/2016 0956   EOSABS 382 10/13/2017 0830   EOSABS 0.3 04/21/2015 1708   BASOSABS 50 10/13/2017 0830   BASOSABS 0.0 04/21/2015 1708   CMP     Component Value Date/Time   NA 141 10/13/2017 0830   NA 139 10/19/2015 0920   K 4.1 10/13/2017 0830   CL 104 10/13/2017 0830   CO2 29 10/13/2017 0830   GLUCOSE 98 10/13/2017 0830   BUN 11 10/13/2017 0830   BUN 13 10/19/2015 0920   CREATININE 1.03 10/13/2017 0830   CALCIUM 9.1 10/13/2017 0830   PROT 6.6 10/13/2017 0830   PROT 6.8 10/19/2015 0920   ALBUMIN 4.4 10/27/2016 0956   ALBUMIN 4.4 10/19/2015 0920   AST 25 10/13/2017 0830   ALT 37 10/13/2017 0830   ALKPHOS 69 10/27/2016 0956   BILITOT 1.3 (H) 10/13/2017 0830   BILITOT 0.9 10/19/2015 0920   GFRNONAA 81 10/13/2017 0830   GFRAA 94 10/13/2017 0830    Imaging Studies: No results found.  Assessment and Plan:   Jonathan Ortega is a 57 y.o. y/o male has been referred for history of GERD chronically,  and to evaluate for screening colonoscopy  No alarm symptoms present at this time Patient on Prilosec daily, and missing doses leads to return in symptoms Reports breakthrough symptoms 1-2 times a month while laying down I discussed taking Prilosec in the morning, but patient states he prefers to take in the evening, as he does not have time to take it 30 minutes before food in the morning, and since his symptoms are mostly at night, he would rather take it in the evening.  He is over 50, and due to chronic history of acid reflux, will evaluate with EGD to rule out underlying Barrett's Patient is willing to try Zantac instead of Prilosec after his EGD  is done He would like a 90-day supply of the medication once it is prescribed  Patient educated extensively on acid reflux lifestyle modification, including buying a bed wedge, not eating 3 hrs before bedtime, diet modifications, and handout given for the same.   Due for screening colonoscopy, will order at this time as well  I have discussed alternative options, risks & benefits,  which include, but are not limited to, bleeding, infection, perforation,respiratory complication & drug reaction.  The patient agrees with this plan & written consent will be obtained.     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

## 2017-12-11 NOTE — Patient Instructions (Signed)
F/U 6 months bedwedge CONTACT IMAGING AT [facility] FOR SCHEDULING YOUR APPT FOR ULTRASOUND. [phone].   Gastroesophageal Reflux Disease, Adult Normally, food travels down the esophagus and stays in the stomach to be digested. If a person has gastroesophageal reflux disease (GERD), food and stomach acid move back up into the esophagus. When this happens, the esophagus becomes sore and swollen (inflamed). Over time, GERD can make small holes (ulcers) in the lining of the esophagus. Follow these instructions at home: Diet  Follow a diet as told by your doctor. You may need to avoid foods and drinks such as: ? Coffee and tea (with or without caffeine). ? Drinks that contain alcohol. ? Energy drinks and sports drinks. ? Carbonated drinks or sodas. ? Chocolate and cocoa. ? Peppermint and mint flavorings. ? Garlic and onions. ? Horseradish. ? Spicy and acidic foods, such as peppers, chili powder, curry powder, vinegar, hot sauces, and BBQ sauce. ? Citrus fruit juices and citrus fruits, such as oranges, lemons, and limes. ? Tomato-based foods, such as red sauce, chili, salsa, and pizza with red sauce. ? Fried and fatty foods, such as donuts, french fries, potato chips, and high-fat dressings. ? High-fat meats, such as hot dogs, rib eye steak, sausage, ham, and bacon. ? High-fat dairy items, such as whole milk, butter, and cream cheese.  Eat small meals often. Avoid eating large meals.  Avoid drinking large amounts of liquid with your meals.  Avoid eating meals during the 2-3 hours before bedtime.  Avoid lying down right after you eat.  Do not exercise right after you eat. General instructions  Pay attention to any changes in your symptoms.  Take over-the-counter and prescription medicines only as told by your doctor. Do not take aspirin, ibuprofen, or other NSAIDs unless your doctor says it is okay.  Do not use any tobacco products, including cigarettes, chewing tobacco, and  e-cigarettes. If you need help quitting, ask your doctor.  Wear loose clothes. Do not wear anything tight around your waist.  Raise (elevate) the head of your bed about 6 inches (15 cm).  Try to lower your stress. If you need help doing this, ask your doctor.  If you are overweight, lose an amount of weight that is healthy for you. Ask your doctor about a safe weight loss goal.  Keep all follow-up visits as told by your doctor. This is important. Contact a doctor if:  You have new symptoms.  You lose weight and you do not know why it is happening.  You have trouble swallowing, or it hurts to swallow.  You have wheezing or a cough that keeps happening.  Your symptoms do not get better with treatment.  You have a hoarse voice. Get help right away if:  You have pain in your arms, neck, jaw, teeth, or back.  You feel sweaty, dizzy, or light-headed.  You have chest pain or shortness of breath.  You throw up (vomit) and your throw up looks like blood or coffee grounds.  You pass out (faint).  Your poop (stool) is bloody or black.  You cannot swallow, drink, or eat. This information is not intended to replace advice given to you by your health care provider. Make sure you discuss any questions you have with your health care provider. Document Released: 11/09/2007 Document Revised: 10/29/2015 Document Reviewed: 09/17/2014 Elsevier Interactive Patient Education  [employer].

## 2017-12-11 NOTE — Addendum Note (Signed)
Addended by: Earl Lagos on: 12/11/2017 12:26 PM   Modules accepted: Orders

## 2017-12-12 NOTE — Telephone Encounter (Signed)
Pt advised to go to [facility] for labs.

## 2017-12-15 ENCOUNTER — Other Ambulatory Visit: Payer: Self-pay

## 2017-12-15 DIAGNOSIS — Z1211 Encounter for screening for malignant neoplasm of colon: Secondary | ICD-10-CM

## 2017-12-15 DIAGNOSIS — K219 Gastro-esophageal reflux disease without esophagitis: Secondary | ICD-10-CM

## 2017-12-19 ENCOUNTER — Other Ambulatory Visit
Admission: RE | Admit: 2017-12-19 | Discharge: 2017-12-19 | Disposition: A | Discharge status: Home / Self Care | Payer: BLUE CROSS/BLUE SHIELD | Source: Ambulatory Visit | Attending: Gastroenterology | Admitting: Gastroenterology

## 2017-12-19 DIAGNOSIS — K760 Fatty (change of) liver, not elsewhere classified: Secondary | ICD-10-CM

## 2017-12-19 DIAGNOSIS — R748 Abnormal levels of other serum enzymes: Secondary | ICD-10-CM | POA: Diagnosis not present

## 2017-12-19 LAB — FERRITIN: FERRITIN: 182 ng/mL (ref 24–336)

## 2017-12-19 LAB — PROTIME-INR
INR: 0.91
Prothrombin Time: 12.2 seconds (ref 11.4–15.2)

## 2017-12-20 LAB — HCV COMMENT:

## 2017-12-20 LAB — HEPATITIS A ANTIBODY, TOTAL: Hep A Total Ab: NEGATIVE

## 2017-12-20 LAB — HEPATITIS B CORE ANTIBODY, TOTAL: Hep B Core Total Ab: NEGATIVE

## 2017-12-20 LAB — MITOCHONDRIAL/SMOOTH MUSCLE AB PNL: F-Actin IgG: 7 Units (ref 0–19)

## 2017-12-20 LAB — HEPATITIS B SURFACE ANTIGEN: Hepatitis B Surface Ag: NEGATIVE

## 2017-12-20 LAB — HEPATITIS B SURFACE ANTIBODY,QUALITATIVE: Hep B S Ab: NONREACTIVE

## 2017-12-20 LAB — CERULOPLASMIN: Ceruloplasmin: 19.4 mg/dL (ref 16.0–31.0)

## 2017-12-20 LAB — HEPATITIS C ANTIBODY (REFLEX)

## 2017-12-22 ENCOUNTER — Ambulatory Visit
Admission: RE | Admit: 2017-12-22 | Discharge: 2017-12-22 | Disposition: A | Discharge status: Home / Self Care | Payer: BLUE CROSS/BLUE SHIELD | Source: Ambulatory Visit | Attending: Gastroenterology | Admitting: Gastroenterology

## 2017-12-22 DIAGNOSIS — R748 Abnormal levels of other serum enzymes: Secondary | ICD-10-CM | POA: Diagnosis not present

## 2017-12-22 DIAGNOSIS — K760 Fatty (change of) liver, not elsewhere classified: Secondary | ICD-10-CM | POA: Insufficient documentation

## 2017-12-22 DIAGNOSIS — R7989 Other specified abnormal findings of blood chemistry: Secondary | ICD-10-CM | POA: Diagnosis not present

## 2018-01-05 ENCOUNTER — Ambulatory Visit: Payer: BLUE CROSS/BLUE SHIELD | Admitting: Anesthesiology

## 2018-01-05 ENCOUNTER — Ambulatory Visit
Admission: RE | Admit: 2018-01-05 | Discharge: 2018-01-05 | Disposition: A | Discharge status: Home / Self Care | Payer: BLUE CROSS/BLUE SHIELD | Source: Ambulatory Visit | Attending: Gastroenterology | Admitting: Gastroenterology

## 2018-01-05 ENCOUNTER — Encounter
Admission: RE | Disposition: A | Discharge status: Home / Self Care | Payer: Self-pay | Source: Ambulatory Visit | Attending: Gastroenterology

## 2018-01-05 ENCOUNTER — Encounter: Payer: Self-pay | Admitting: Anesthesiology

## 2018-01-05 DIAGNOSIS — Z7982 Long term (current) use of aspirin: Secondary | ICD-10-CM | POA: Diagnosis not present

## 2018-01-05 DIAGNOSIS — Z1211 Encounter for screening for malignant neoplasm of colon: Secondary | ICD-10-CM | POA: Diagnosis not present

## 2018-01-05 DIAGNOSIS — K228 Other specified diseases of esophagus: Secondary | ICD-10-CM

## 2018-01-05 DIAGNOSIS — D124 Benign neoplasm of descending colon: Secondary | ICD-10-CM | POA: Diagnosis not present

## 2018-01-05 DIAGNOSIS — D122 Benign neoplasm of ascending colon: Secondary | ICD-10-CM | POA: Insufficient documentation

## 2018-01-05 DIAGNOSIS — K5730 Diverticulosis of large intestine without perforation or abscess without bleeding: Secondary | ICD-10-CM | POA: Diagnosis not present

## 2018-01-05 DIAGNOSIS — K2289 Other specified disease of esophagus: Secondary | ICD-10-CM

## 2018-01-05 DIAGNOSIS — K210 Gastro-esophageal reflux disease with esophagitis: Secondary | ICD-10-CM | POA: Insufficient documentation

## 2018-01-05 DIAGNOSIS — Z800 Family history of malignant neoplasm of digestive organs: Secondary | ICD-10-CM | POA: Insufficient documentation

## 2018-01-05 DIAGNOSIS — D490 Neoplasm of unspecified behavior of digestive system: Secondary | ICD-10-CM

## 2018-01-05 DIAGNOSIS — Z79899 Other long term (current) drug therapy: Secondary | ICD-10-CM | POA: Insufficient documentation

## 2018-01-05 DIAGNOSIS — K3189 Other diseases of stomach and duodenum: Secondary | ICD-10-CM | POA: Diagnosis not present

## 2018-01-05 DIAGNOSIS — K317 Polyp of stomach and duodenum: Secondary | ICD-10-CM | POA: Diagnosis not present

## 2018-01-05 DIAGNOSIS — R12 Heartburn: Secondary | ICD-10-CM | POA: Diagnosis not present

## 2018-01-05 DIAGNOSIS — K635 Polyp of colon: Secondary | ICD-10-CM | POA: Diagnosis not present

## 2018-01-05 DIAGNOSIS — M1990 Unspecified osteoarthritis, unspecified site: Secondary | ICD-10-CM | POA: Insufficient documentation

## 2018-01-05 DIAGNOSIS — K2950 Unspecified chronic gastritis without bleeding: Secondary | ICD-10-CM | POA: Diagnosis not present

## 2018-01-05 DIAGNOSIS — K219 Gastro-esophageal reflux disease without esophagitis: Secondary | ICD-10-CM

## 2018-01-05 DIAGNOSIS — K5790 Diverticulosis of intestine, part unspecified, without perforation or abscess without bleeding: Secondary | ICD-10-CM | POA: Diagnosis not present

## 2018-01-05 HISTORY — PX: ESOPHAGOGASTRODUODENOSCOPY (EGD) WITH PROPOFOL: SHX5813

## 2018-01-05 HISTORY — PX: COLONOSCOPY WITH PROPOFOL: SHX5780

## 2018-01-05 SURGERY — COLONOSCOPY WITH PROPOFOL
Anesthesia: General

## 2018-01-05 MED ORDER — RANITIDINE HCL 150 MG PO TABS: 150.0000 mg | ORAL_TABLET | Freq: Every day | ORAL | 0 refills | Status: DC

## 2018-01-05 MED ORDER — PROPOFOL 500 MG/50ML IV EMUL
INTRAVENOUS | Status: AC
  Filled 2018-01-05: qty 50

## 2018-01-05 MED ORDER — SODIUM CHLORIDE 0.9 % IV SOLN
INTRAVENOUS | Status: DC
  Administered 2018-01-05: 1000 mL via INTRAVENOUS

## 2018-01-05 NOTE — Anesthesia Preprocedure Evaluation (Signed)
Anesthesia Evaluation  Patient identified by MRN, date of birth, ID band Patient awake    Reviewed: Allergy & Precautions, NPO status , Patient's Chart, lab work & pertinent test results  Airway Mallampati: II  TM Distance: >3 FB     Dental  (+) Teeth Intact   Pulmonary neg pulmonary ROS,    Pulmonary exam normal        Cardiovascular negative cardio ROS Normal cardiovascular exam     Neuro/Psych negative neurological ROS  negative psych ROS   GI/Hepatic Neg liver ROS, GERD  Medicated,  Endo/Other  negative endocrine ROS  Renal/GU negative Renal ROS  negative genitourinary   Musculoskeletal  (+) Arthritis , Osteoarthritis,    Abdominal Normal abdominal exam  (+)   Peds negative pediatric ROS (+)  Hematology   Anesthesia Other Findings   Reproductive/Obstetrics                             Anesthesia Physical Anesthesia Plan  ASA: II  Anesthesia Plan: General   Post-op Pain Management:    Induction: Intravenous  PONV Risk Score and Plan: TIVA  Airway Management Planned: Nasal Cannula  Additional Equipment:   Intra-op Plan:   Post-operative Plan:   Informed Consent: I have reviewed the patients History and Physical, chart, labs and discussed the procedure including the risks, benefits and alternatives for the proposed anesthesia with the patient or authorized representative who has indicated his/her understanding and acceptance.   Dental advisory given  Plan Discussed with: CRNA and Surgeon  Anesthesia Plan Comments:         Anesthesia Quick Evaluation

## 2018-01-05 NOTE — Op Note (Signed)
[facility] Gastroenterology Patient Name: Jonathan Ortega Procedure Date: 01/05/2018 11:17 AM MRN: [id number] Account #: [id number] Date of Birth: [birth year] Admit Type: Outpatient Age: 57 Room: [facility] ENDO ROOM 2 Gender: Male Note Status: Finalized Procedure:            Upper GI endoscopy Indications:          Heartburn Providers:            Tico Crotteau B. Bonna Gains MD, MD Referring MD:         [name] (Referring MD) Medicines:            Monitored Anesthesia Care Complications:        No immediate complications. Procedure:            Pre-Anesthesia Assessment:                       - Prior to the procedure, a History and Physical was                        performed, and patient medications, allergies and                        sensitivities were reviewed. The patient's tolerance of                        previous anesthesia was reviewed.                       - The risks and benefits of the procedure and the                        sedation options and risks were discussed with the                        patient. All questions were answered and informed                        consent was obtained.                       - Patient identification and proposed procedure were                        verified prior to the procedure by the physician, the                        nurse, the anesthesiologist, the anesthetist and the                        technician. The procedure was verified in the procedure                        room.                       - ASA Grade Assessment: II - A patient with mild                        systemic disease.                       After obtaining  informed consent, the endoscope was                        passed under direct vision. Throughout the procedure,                        the patient's blood pressure, pulse, and oxygen                        saturations were monitored continuously. The Endoscope                         was introduced through the mouth, and advanced to the                        second part of duodenum. The upper GI endoscopy was                        accomplished with ease. The patient tolerated the                        procedure well. Findings:      The Z-line was irregular and was found 39 cm from the incisors. Mucosa       was biopsied with a cold forceps for histology in 4 quadrants at the       gastroesophageal junction.      The examined esophagus was normal.      Multiple 2 to 4 mm sessile polyps with no bleeding and no stigmata of       recent bleeding were found in the gastric body. Biopsies were taken with       a cold forceps for histology.      Patchy mildly erythematous mucosa without bleeding was found in the       gastric antrum. Biopsies were taken with a cold forceps for histology.       Biopsies were obtained in the gastric body, at the incisura and in the       gastric antrum with cold forceps for histology.      The duodenal bulb, second portion of the duodenum and examined duodenum       were normal. Impression:           - Z-line irregular, 39 cm from the incisors. Biopsied.                       - Normal esophagus.                       - Multiple gastric polyps. Biopsied. 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 Biopsied.                       - Normal duodenal bulb, second portion of the duodenum                        and examined duodenum.                       -  Biopsies were obtained in the gastric body, at the                        incisura and in the gastric antrum. Recommendation:       - Await pathology results.                       - Discharge patient to home (with escort).                       - Advance diet as tolerated.                       - Continue present medications.                       - Patient has a contact number available for                         emergencies. The signs and symptoms of potential                        delayed complications were discussed with the patient.                        Return to normal activities tomorrow. Written discharge                        instructions were provided to the patient.                       - Discharge patient to home (with escort).                       - The findings and recommendations were discussed with                        the patient.                       - The findings and recommendations were discussed with                        the patient's family.                       - Change PPI to Zantac as no esophagitis or salmon                        colored mucosa present. Zantac can treat his heartburn                        and pt has not had any heartburn despite being off PPI                        for 3-4 days. It would also help lead to reversal of                        gastric polyps. Procedure Code(s):    --- Professional ---  70623, Esophagogastroduodenoscopy, flexible, transoral;                        with biopsy, single or multiple Diagnosis Code(s):    --- Professional ---                       K22.8, Other specified diseases of esophagus                       K31.7, Polyp of stomach and duodenum                       K31.89, Other diseases of stomach and duodenum                       R12, Heartburn CPT copyright 2017 American Medical Association. All rights reserved. The codes documented in this report are preliminary and upon coder review may  be revised to meet current compliance requirements.  [name], MD Margretta Sidle B. Bonna Gains MD, MD 01/05/2018 11:37:43 AM This report has been signed electronically. Number of Addenda: 0 Note Initiated On: 01/05/2018 11:17 AM Estimated Blood Loss: Estimated blood loss: none.      [facility]

## 2018-01-05 NOTE — Anesthesia Postprocedure Evaluation (Signed)
Anesthesia Post Note  Patient: Reginald Weida Fernholz  Procedure(s) Performed: COLONOSCOPY WITH PROPOFOL (N/A ) ESOPHAGOGASTRODUODENOSCOPY (EGD) WITH PROPOFOL (N/A )  Patient location during evaluation: Endoscopy Anesthesia Type: General Level of consciousness: awake and alert and oriented Pain management: pain level controlled Vital Signs Assessment: post-procedure vital signs reviewed and stable Respiratory status: spontaneous breathing Cardiovascular status: blood pressure returned to baseline Anesthetic complications: no     Last Vitals:  Vitals:   01/05/18 0951 01/05/18 1213  BP: (!) 133/98 121/81  Pulse: 78 71  Resp: 16 16  Temp: (!) 35.8 C (!) 36.3 C  SpO2: 100% 100%    Last Pain:  Vitals:   01/05/18 1213  TempSrc: Tympanic  PainSc: Asleep                 Avy Barlett

## 2018-01-05 NOTE — Anesthesia Post-op Follow-up Note (Signed)
Anesthesia QCDR form completed.        

## 2018-01-05 NOTE — Transfer of Care (Signed)
Immediate Anesthesia Transfer of Care Note  Patient: Jonathan Ortega  Procedure(s) Performed: COLONOSCOPY WITH PROPOFOL (N/A ) ESOPHAGOGASTRODUODENOSCOPY (EGD) WITH PROPOFOL (N/A )  Patient Location: PACU and Endoscopy Unit  Anesthesia Type:General  Level of Consciousness: awake, alert  and oriented  Airway & Oxygen Therapy: Patient Spontanous Breathing  Post-op Assessment: Report given to RN  Post vital signs: stable  Last Vitals:  Vitals Value Taken Time  BP 121/81   Temp 97.3   Pulse 71 01/05/2018 12:13 PM  Resp 16 01/05/2018 12:13 PM  SpO2 100 % 01/05/2018 12:13 PM  Vitals shown include unvalidated device data.  Last Pain:  Vitals:   01/05/18 0951  TempSrc: Tympanic  PainSc: 0-No pain         Complications: No apparent anesthesia complications

## 2018-01-05 NOTE — Op Note (Signed)
[facility] Patient Name: Jonathan Ortega Procedure Date: 01/05/2018 11:17 AM MRN: [id number] Account #: [id number] Date of Birth: [birth year] Admit Type: Outpatient Age: 57 Room: [facility] ENDO ROOM 2 Gender: Male Note Status: Finalized Procedure:            Colonoscopy Indications:          Screening for colorectal malignant neoplasm Providers:            Varnita B. Bonna Gains MD, MD Referring MD:         [name] (Referring MD) Medicines:            Monitored Anesthesia Care Complications:        No immediate complications. Procedure:            Pre-Anesthesia Assessment:                       - ASA Grade Assessment: II - A patient with mild                        systemic disease.                       - Prior to the procedure, a History and Physical was                        performed, and patient medications, allergies and                        sensitivities were reviewed. The patient's tolerance of                        previous anesthesia was reviewed.                       - The risks and benefits of the procedure and the                        sedation options and risks were discussed with the                        patient. All questions were answered and informed                        consent was obtained.                       - Patient identification and proposed procedure were                        verified prior to the procedure by the physician, the                        nurse, the anesthesiologist, the anesthetist and the                        technician. The procedure was verified in the procedure                        room.  After obtaining informed consent, the colonoscope was                        passed under direct vision. Throughout the procedure,                        the patient's blood pressure, pulse, and oxygen                        saturations were monitored continuously. The                       Colonoscope was introduced through the anus and                        advanced to the the cecum, identified by appendiceal                        orifice and ileocecal valve. The colonoscopy was                        performed with ease. The patient tolerated the                        procedure well. The quality of the bowel preparation                        was good. Findings:      The perianal and digital rectal examinations were normal.      Two sessile polyps were found in the descending colon and ascending       colon. The polyps were 3 to 4 mm in size. These polyps were removed with       a cold biopsy forceps. Resection and retrieval were complete.      Multiple diverticula were found in the sigmoid colon.      The exam was otherwise without abnormality.      The rectum, sigmoid colon, descending colon, transverse colon, ascending       colon and cecum appeared normal.      A 6 mm polypoid lesion was found in the rectum (on retroflexion). The       lesion was pedunculated. No bleeding was present. This was below the       anal verge and was not biopsied or removed. This will need surgical       evaluation. Impression:           - Two 3 to 4 mm polyps in the descending colon and in                        the ascending colon, removed with a cold biopsy                        forceps. Resected and retrieved.                       - Diverticulosis in the sigmoid colon.                       - The examination was otherwise normal.                       -  The rectum, sigmoid colon, descending colon,                        transverse colon, ascending colon and cecum are normal.                       - Likely benign polypoid lesion below the anal verge.                        Removal was not done. Recommendation:       - Discharge patient to home (with escort).                       - High fiber diet.                       - Advance diet as tolerated.                        - Continue present medications.                       - Await pathology results.                       - Repeat colonoscopy in 5 years for surveillance.                       - The findings and recommendations were discussed with                        the patient.                       - The findings and recommendations were discussed with                        the patient's family.                       - Return to primary care physician as previously                        scheduled.                       - Refer to a surgeon at appointment to be scheduled. Procedure Code(s):    --- Professional ---                       [phone], Colonoscopy, flexible; with biopsy, single or                        multiple Diagnosis Code(s):    --- Professional ---                       Z12.11, Encounter for screening for malignant neoplasm                        of colon                       D12.4, Benign neoplasm of descending colon  D12.2, Benign neoplasm of ascending colon                       D49.0, Neoplasm of unspecified behavior of digestive                        system                       K57.30, Diverticulosis of large intestine without                        perforation or abscess without bleeding CPT copyright 2017 American Medical Association. All rights reserved. The codes documented in this report are preliminary and upon coder review may  be revised to meet current compliance requirements.  [name], MD Margretta Sidle B. Tahiliani MD, MD 01/05/2018 12:09:26 PM This report has been signed electronically. Number of Addenda: 0 Note Initiated On: 01/05/2018 11:17 AM Scope Withdrawal Time: 0 hours 18 minutes 30 seconds  Total Procedure Duration: 0 hours 24 minutes 53 seconds  Estimated Blood Loss: Estimated blood loss: none.      [facility]

## 2018-01-08 ENCOUNTER — Encounter: Payer: Self-pay | Admitting: Gastroenterology

## 2018-01-08 LAB — SURGICAL PATHOLOGY

## 2018-02-28 ENCOUNTER — Other Ambulatory Visit: Payer: Self-pay | Admitting: Gastroenterology

## 2018-04-04 ENCOUNTER — Telehealth: Payer: Self-pay

## 2018-04-04 NOTE — Telephone Encounter (Signed)
-----   Message from Virgel Manifold, MD sent at 03/27/2018  1:14 PM EDT ----- Jackelyn Poling, please see colonoscopy report.  Patient was supposed to be referred to a surgeon due to polypoid mucosa seen in his rectum.  Please refer to [facility]

## 2018-04-04 NOTE — Telephone Encounter (Signed)
Pt contacted in regards for his referral that was sent in on 01/17/18 initially, confirmation was received. It was refaxed on 01/23/18 (having difficulty with there faxes) on it was verbally verified it was received on 01/24/18. Pt was left a voice mail on 01/30/18 to see if he had been contacted with no return call. Today I spoke with pt and he has not heard anything but states they could text him or email him at [email or web address]. I offered to give him there phone number but he declined.  I called [facility] and they referred to Judson Roch in [place] who does scheduling. Phone [phone]. I left a detailed message about the initial time of referral and thereafter; pt name/dob, email address and that he could be contacted by text if this was possible. I also left message as to what reason pt is being referred for: lesion seen below anal verge on colonoscopy.

## 2018-04-06 ENCOUNTER — Ambulatory Visit (INDEPENDENT_AMBULATORY_CARE_PROVIDER_SITE_OTHER): Payer: BLUE CROSS/BLUE SHIELD

## 2018-04-06 DIAGNOSIS — Z23 Encounter for immunization: Secondary | ICD-10-CM | POA: Diagnosis not present

## 2018-05-28 NOTE — Telephone Encounter (Signed)
Letter sent out to pt to call [facility] to schedule his appt.

## 2018-06-20 LAB — HM DIABETES EYE EXAM

## 2018-06-21 ENCOUNTER — Encounter: Payer: Self-pay | Admitting: Family Medicine

## 2018-07-09 ENCOUNTER — Ambulatory Visit (INDEPENDENT_AMBULATORY_CARE_PROVIDER_SITE_OTHER): Payer: BLUE CROSS/BLUE SHIELD | Admitting: Family Medicine

## 2018-07-09 ENCOUNTER — Encounter: Payer: Self-pay | Admitting: Family Medicine

## 2018-07-09 VITALS — BP 142/84 | HR 80 | Temp 98.2°F | Resp 16 | Ht 69.0 in | Wt 196.6 lb

## 2018-07-09 DIAGNOSIS — I10 Essential (primary) hypertension: Secondary | ICD-10-CM | POA: Insufficient documentation

## 2018-07-09 DIAGNOSIS — H532 Diplopia: Secondary | ICD-10-CM | POA: Diagnosis not present

## 2018-07-09 DIAGNOSIS — R030 Elevated blood-pressure reading, without diagnosis of hypertension: Secondary | ICD-10-CM

## 2018-07-09 MED ORDER — AMLODIPINE BESYLATE 5 MG PO TABS: 5.0000 mg | ORAL_TABLET | Freq: Every day | ORAL | 2 refills | Status: DC

## 2018-07-09 NOTE — Patient Instructions (Addendum)
Thank you for coming to the office today.  Start Amlodipine 5mg  daily for blood pressure.  Keep track of BP on cuff, check about every day or every other day  If double vision does not improve with medicine or time, then need to call back to your eye doctor to follow-up.  If any new acute symptoms of stroke or numbness tingling weakness, slurred speech, loss of vision, chest pain or short of breath seek help at hospital immediately Emergency Dept or call 911  Call me within 1-2 week with update on BP readings and medicine / symptoms.   Please schedule a Follow-up Appointment to: Return in about 4 weeks (around 08/06/2018) for blood pressure.  If you have any other questions or concerns, please feel free to call the office or send a message through [place]. You may also schedule an earlier appointment if necessary.  Additionally, you may be receiving a survey about your experience at our office within a few days to 1 week by e-mail or mail. We value your feedback.  [name], DO [place]

## 2018-07-09 NOTE — Progress Notes (Signed)
Subjective:    Patient ID: Jonathan Ortega, male    DOB: 11-07-1960, 58 y.o.   MRN: [id number]  Jonathan Ortega is a 58 y.o. male presenting on 07/09/2018 for Hypertension (blurred vision onset 3 days,HA, yesterday B/P at pharamcy check 177/102 pulse 86 bpm, chills and plapitation cople of nights happen only when he feels hot flashes at night onset month)  Patient presents for a same day appointment.  HPI   Double Vision / Elevated Blood Pressure / Night Sweat / Palpitation - Reports new problem within past 3 days with acute worsening blurry vision with double vision, dull frontal headache, and reported elevated BP at pharmacy up to 170/100, and here in office elevated to 140/90, in past he has not had significantly elevated BP only on occasion, no prior diagnosis hypertension.  Today he reports concern with some elevated BP. He does not have home cuff to check it. He has recently seen Dr [name] [facility] few week ago was told vision is good, and no new concerns, he was not having double vision at that time. - Slight increased double vision looking down - never been on anti-HTN medication before - Admits some stress at work recently - he attributes some symptom to this - Admits some hot flashes at night, but otherwise no fevers, chills sweats during day Denies any chest pain, dyspnea, chest pressure, loss of vision, numbness weakness tingling  Additionally He is planning trip to go to [place] leaving on plane tomorrow for 3 days.   Depression screen [facility] 2/9 07/09/2018 10/20/2017 04/26/2017  Decreased Interest 0 0 0  Down, Depressed, Hopeless 0 0 0  PHQ - 2 Score 0 0 0    Social History   Tobacco Use  . Smoking status: Never Smoker  . Smokeless tobacco: Never Used  Substance Use Topics  . Alcohol use: Yes    Alcohol/week: 0.0 standard drinks    Comment: have not had a drink since last month   . Drug use: No    Review of Systems Per HPI unless specifically indicated  above     Objective:    BP (!) 142/84 (BP Location: Left Arm, Cuff Size: Normal)   Pulse 80   Temp 98.2 F (36.8 C) (Oral)   Resp 16   Ht 5\' 9"  (1.753 m)   Wt 196 lb 9.6 oz (89.2 kg)   SpO2 100%   BMI 29.03 kg/m   Wt Readings from Last 3 Encounters:  07/09/18 196 lb 9.6 oz (89.2 kg)  01/05/18 210 lb (95.3 kg)  12/11/17 210 lb 9.6 oz (95.5 kg)    Physical Exam Vitals signs and nursing note reviewed.  Constitutional:      General: He is not in acute distress.    Appearance: He is well-developed. He is not diaphoretic.     Comments: Well-appearing, comfortable, cooperative  HENT:     Head: Normocephalic and atraumatic.     Comments: Frontal / maxillary sinuses non-tender. Nares patent without purulence or edema. Bilateral TMs clear without erythema, effusion or bulging. Oropharynx clear without erythema, exudates, edema or asymmetry. Eyes:     General:        Right eye: No discharge.        Left eye: No discharge.     Extraocular Movements: Extraocular movements intact.     Conjunctiva/sclera: Conjunctivae normal.     Pupils: Pupils are equal, round, and reactive to light.     Comments: Mild increased double  vision on eye movement lower visual field  Neck:     Musculoskeletal: Normal range of motion and neck supple.     Thyroid: No thyromegaly.  Cardiovascular:     Rate and Rhythm: Normal rate and regular rhythm.     Heart sounds: Normal heart sounds. No murmur.  Pulmonary:     Effort: Pulmonary effort is normal. No respiratory distress.     Breath sounds: Normal breath sounds. No wheezing or rales.  Musculoskeletal: Normal range of motion.  Lymphadenopathy:     Cervical: No cervical adenopathy.  Skin:    General: Skin is warm and dry.     Findings: No erythema or rash.  Neurological:     General: No focal deficit present.     Mental Status: He is alert and oriented to person, place, and time.     Cranial Nerves: No cranial nerve deficit.     Sensory: No sensory  deficit.     Comments: Distal sensation to light touch intact upper and lower extremity.  Psychiatric:        Behavior: Behavior normal.     Comments: Well groomed, good eye contact, normal speech and thoughts    Results for orders placed or performed in visit on 06/21/18  HM DIABETES EYE EXAM  Result Value Ref Range   HM Diabetic Eye Exam No Retinopathy No Retinopathy      Assessment & Plan:   Problem List Items Addressed This Visit    Elevated BP without diagnosis of hypertension - Primary   Relevant Medications   amLODipine (NORVASC) 5 MG tablet    Other Visit Diagnoses    Double vision          Mildly elevated initial BP, repeat manual check improved but still above goal SBP >140 Question accuracy of BP reading at pharmacy Ultimately suspect elevated BP may be underlying culprit to some of his symptoms. - Home BP readings  none No known complications    Plan:  1. Start empiric therapy of new HTN med - Amlodipine 5mg  daily, instructions benefit/risk given 2. Encourage improved lifestyle - low sodium diet, regular exercise 3. Start home blood pressure cuff monitor BP outside office, bring readings to next visit, if persistently >140/90 or new symptoms notify office sooner 4. Follow-up by phone 1-2 week with BP readings, may adjust med otherwise, schedule f/u in 4 weeks for re-check BP, also he was advised to notify his eye doctor at any point if vision worse or new concerns.  Reviewed very strict return criteria when to go to office vs go to hospital ED / 911 if new stroke symptoms or focal neurological symptoms, or severe elevated BP with worse symptoms    Meds ordered this encounter  Medications  . amLODipine (NORVASC) 5 MG tablet    Sig: Take 1 tablet (5 mg total) by mouth daily.    Dispense:  30 tablet    Refill:  2     Follow up plan: Return in about 4 weeks (around 08/06/2018) for blood pressure.   [name], DO [facility] 07/09/2018, 11:11 AM

## 2018-07-13 ENCOUNTER — Other Ambulatory Visit: Payer: Self-pay | Admitting: Ophthalmology

## 2018-07-13 DIAGNOSIS — H532 Diplopia: Secondary | ICD-10-CM | POA: Diagnosis not present

## 2018-07-17 ENCOUNTER — Ambulatory Visit
Admission: RE | Admit: 2018-07-17 | Discharge: 2018-07-17 | Disposition: A | Discharge status: Home / Self Care | Payer: BLUE CROSS/BLUE SHIELD | Source: Ambulatory Visit | Attending: Ophthalmology | Admitting: Ophthalmology

## 2018-07-17 ENCOUNTER — Other Ambulatory Visit
Admission: RE | Admit: 2018-07-17 | Discharge: 2018-07-17 | Disposition: A | Discharge status: Home / Self Care | Payer: BLUE CROSS/BLUE SHIELD | Source: Home / Self Care | Attending: Ophthalmology | Admitting: Ophthalmology

## 2018-07-17 DIAGNOSIS — H532 Diplopia: Secondary | ICD-10-CM | POA: Insufficient documentation

## 2018-07-17 DIAGNOSIS — R42 Dizziness and giddiness: Secondary | ICD-10-CM | POA: Diagnosis not present

## 2018-07-17 HISTORY — DX: Essential (primary) hypertension: I10

## 2018-07-17 LAB — CREATININE, SERUM
Creatinine, Ser: 0.93 mg/dL (ref 0.61–1.24)
GFR calc non Af Amer: >60 mL/min (ref >60)

## 2018-07-17 LAB — BUN: BUN: 13 mg/dL (ref 6–20)

## 2018-07-17 MED ORDER — IOPAMIDOL (ISOVUE-370) INJECTION 76%
100.0000 mL | Freq: Once | INTRAVENOUS | Status: AC | PRN
  Administered 2018-07-17: 100 mL via INTRAVENOUS

## 2018-08-06 ENCOUNTER — Telehealth: Payer: Self-pay | Admitting: Family Medicine

## 2018-08-06 NOTE — Telephone Encounter (Signed)
Acknowledged. I do have access to Neck CT angio. It is a normal result, no abnormality identified. We can review in office as requested.  [name], DO [facility] 08/06/2018, 5:27 PM

## 2018-08-06 NOTE — Telephone Encounter (Signed)
Advised patient that we can able to view CT angiography of head and neck. He wants to discuss this test result with  Dr. Raliegh Ip at his upcoming appointment.

## 2018-08-06 NOTE — Telephone Encounter (Signed)
Pt has appt on Wednesday and just wanted to make sure Dr. Raliegh Ip can see results of MRI ordered by Dr. Neville Route at [facility].  His call back number is [phone]

## 2018-08-08 ENCOUNTER — Other Ambulatory Visit: Payer: Self-pay

## 2018-08-08 ENCOUNTER — Other Ambulatory Visit: Payer: Self-pay | Admitting: Family Medicine

## 2018-08-08 ENCOUNTER — Ambulatory Visit (INDEPENDENT_AMBULATORY_CARE_PROVIDER_SITE_OTHER): Payer: BLUE CROSS/BLUE SHIELD | Admitting: Family Medicine

## 2018-08-08 ENCOUNTER — Encounter: Payer: Self-pay | Admitting: Family Medicine

## 2018-08-08 VITALS — BP 124/80 | HR 72 | Temp 98.1°F | Resp 16 | Ht 69.0 in | Wt 196.0 lb

## 2018-08-08 DIAGNOSIS — H532 Diplopia: Secondary | ICD-10-CM

## 2018-08-08 DIAGNOSIS — I10 Essential (primary) hypertension: Secondary | ICD-10-CM | POA: Diagnosis not present

## 2018-08-08 DIAGNOSIS — E559 Vitamin D deficiency, unspecified: Secondary | ICD-10-CM

## 2018-08-08 DIAGNOSIS — Z0000 Encounter for general adult medical examination without abnormal findings: Secondary | ICD-10-CM

## 2018-08-08 DIAGNOSIS — R351 Nocturia: Secondary | ICD-10-CM

## 2018-08-08 DIAGNOSIS — E782 Mixed hyperlipidemia: Secondary | ICD-10-CM

## 2018-08-08 DIAGNOSIS — R748 Abnormal levels of other serum enzymes: Secondary | ICD-10-CM

## 2018-08-08 DIAGNOSIS — R7303 Prediabetes: Secondary | ICD-10-CM

## 2018-08-08 DIAGNOSIS — N401 Enlarged prostate with lower urinary tract symptoms: Secondary | ICD-10-CM

## 2018-08-08 MED ORDER — AMLODIPINE BESYLATE 5 MG PO TABS: 5.0000 mg | ORAL_TABLET | Freq: Every day | ORAL | 1 refills | Status: DC

## 2018-08-08 NOTE — Assessment & Plan Note (Addendum)
Now well controlled HTN on low dose med CCB, new diagnosis - Home BP readings normalized  No known complications - question diplopia etiology, still undetermined    Plan:  1. Continue current BP regimen Amlodipine 5mg  daily - ordered 90 day rx for mail order 2. Encourage improved lifestyle - low sodium diet, regular exercise 3. REDUCE frequency - Continue monitor BP outside office, bring readings to next visit, if persistently >140/90 or new symptoms notify office sooner 4. Follow-up as scheduled 2 months for annual physical, labs - if symptomatic or low BP < 100/60 we can consider HALF dose Amlodipine for 2.5mg  dosage

## 2018-08-08 NOTE — Patient Instructions (Addendum)
Thank you for coming to the office today.  Mild BP or Hypertension diagnosis. Continue on Amlodipine 5mg  daily, prefer morning dose. If too strong, low BP < 100/60, and lightheaded dizzy, then may need to HOLD a dose, or REDUCE by HALF - cut in half for 2.5mg  - if need this lower dose we can re order it for you.  Refilled 5mg  to [facility] today, you have refills locally  Check BP 1-2 times a week still and write down.  Try to see Dr [name] to discuss his thoughts and results on imaging. Printed copy.  My last thought is that if it is not Eye or Vascular / Blood pressure related - it may be neurological cause, and we can consider referral to Neurology - let me know who or where you would like to go.  If interested in [facility] option - one of these could work  [facility]   Address: [address], [place], [place] [address] Hours: 8AM-5PM Phone: [phone]  [facility] [place]. [place], [place] [address] Phone: [phone]  Please schedule a Follow-up Appointment to: Return if symptoms worsen or fail to improve, for keep apt in May for physical.  If you have any other questions or concerns, please feel free to call the office or send a message through [place]. You may also schedule an earlier appointment if necessary.  Additionally, you may be receiving a survey about your experience at our office within a few days to 1 week by e-mail or mail. We value your feedback.  [name], DO [place]

## 2018-08-08 NOTE — Progress Notes (Signed)
Subjective:    Patient ID: Jonathan Ortega, male    DOB: [birth year], 58 y.o.   MRN: [id number]  Jonathan Ortega is a 58 y.o. male presenting on 08/08/2018 for Hypertension   HPI   FOLLOW-UP Essential Hypertension (new diagnosis) / Double Vision (Diplopia) - Last visit with me 07/09/18, for initial visit for same problem elevated BP and ultimately thought new diagnosis of HTN given history of elevated readings >135 SBP, treated with new start Amlodipine 5mg  trial and monitor BP, see prior notes for background information. - Interval update with resolved night sweats and palpitations. He still has double vision, seems about same, seems to be related to his "up and down vision", he is managing it more on his own, thought may have improved. He did not have resolution after treated for BP recently and then he saw his eye doctor at [name], they could not find an exact eye cause for the double vision, and they did a Head/Neck CTA and did not see any vascular cause. He has not returned yet for results or to discuss with eye doctor again. He has a new pair of glasses now with reading for distance vision seems improved. - Resolved night sweats and palpitations  - Today patient reports he is doing well on BP med. Home BP checks frequently since last visit, few times week were avg 110-120 / 80-84, high 138. Seems better controlled than previous. No further episodes of acute elevated BP - He is asking about CT result. He would like to return to [facility] to discuss with Dr [name]. - Some improvement but still has double vision looking down - Admits some dizzy at times but not light headed, no syncope Denies any chest pain, dyspnea, chest pressure, loss of vision, numbness weakness tingling   Depression screen [facility] 2/9 08/08/2018 07/09/2018 10/20/2017  Decreased Interest 0 0 0  Down, Depressed, Hopeless 0 0 0  PHQ - 2 Score 0 0 0    Social History   Tobacco Use  . Smoking status: Never Smoker  .  Smokeless tobacco: Never Used  Substance Use Topics  . Alcohol use: Yes    Alcohol/week: 0.0 standard drinks    Comment: have not had a drink since last month   . Drug use: No    Review of Systems Per HPI unless specifically indicated above     Objective:    BP 124/80 Comment: manual  Pulse 72   Temp 98.1 F (36.7 C) (Oral)   Resp 16   Ht 5\' 9"  (1.753 m)   Wt 196 lb (88.9 kg)   BMI 28.94 kg/m   Wt Readings from Last 3 Encounters:  08/08/18 196 lb (88.9 kg)  07/09/18 196 lb 9.6 oz (89.2 kg)  01/05/18 210 lb (95.3 kg)    Physical Exam Vitals signs and nursing note reviewed.  Constitutional:      General: He is not in acute distress.    Appearance: He is well-developed. He is not diaphoretic.     Comments: Well-appearing, comfortable, cooperative  HENT:     Head: Normocephalic and atraumatic.  Eyes:     General:        Right eye: No discharge.        Left eye: No discharge.     Extraocular Movements: Extraocular movements intact.     Conjunctiva/sclera: Conjunctivae normal.     Pupils: Pupils are equal, round, and reactive to light.     Comments: Still  experiences provoked diplopia with superior/inferior eye movements.  Neck:     Musculoskeletal: Normal range of motion and neck supple.     Thyroid: No thyromegaly.     Comments: No carotid bruit heard Cardiovascular:     Rate and Rhythm: Normal rate and regular rhythm.     Heart sounds: Normal heart sounds. No murmur.  Pulmonary:     Effort: Pulmonary effort is normal. No respiratory distress.     Breath sounds: Normal breath sounds. No wheezing or rales.  Musculoskeletal: Normal range of motion.  Lymphadenopathy:     Cervical: No cervical adenopathy.  Skin:    General: Skin is warm and dry.     Findings: No erythema or rash.  Neurological:     Mental Status: He is alert and oriented to person, place, and time.  Psychiatric:        Behavior: Behavior normal.     Comments: Well groomed, good eye contact,  normal speech and thoughts     I have personally reviewed the radiology report from 07/17/18 CTA Head/Neck from Ophthalmologist.  CLINICAL DATA:  Diplopia and headache over the last week. New diagnosis of hypertension.  EXAM: CT ANGIOGRAPHY HEAD AND NECK  TECHNIQUE: Multidetector CT imaging of the head and neck was performed using the standard protocol during bolus administration of intravenous contrast. Multiplanar CT image reconstructions and MIPs were obtained to evaluate the vascular anatomy. Carotid stenosis measurements (when applicable) are obtained utilizing NASCET criteria, using the distal internal carotid diameter as the denominator.  CONTRAST:  132mL ISOVUE-370 IOPAMIDOL (ISOVUE-370) INJECTION 76%  COMPARISON:  None.  FINDINGS: CT HEAD FINDINGS  Brain: The brain shows a normal appearance without evidence of malformation, atrophy, old or acute small or large vessel infarction, mass lesion, hemorrhage, hydrocephalus or extra-axial collection.  Vascular: No hyperdense vessel. No evidence of atherosclerotic calcification.  Skull: Normal.  No traumatic finding.  No focal bone lesion.  Sinuses/Orbits: Sinuses are clear. Orbits appear normal. Mastoids are clear.  Other: None significant  CTA NECK FINDINGS  Aortic arch: Normal  Right carotid system: Common carotid artery widely patent to the bifurcation. Carotid bifurcation normal without soft or calcified plaque. Cervical ICA is normal.  Left carotid system: Common carotid artery widely patent to the bifurcation. Carotid bifurcation is normal without soft or calcified plaque. Cervical ICA is normal.  Vertebral arteries: Both vertebral artery origins are widely patent. Both vertebral arteries appear normal through the cervical region to the foramen magnum.  Skeleton: Minimal cervical spondylosis. No significant bone finding.  Other neck: No mass or lymphadenopathy.  Upper chest:  Normal  Review of the MIP images confirms the above findings  CTA HEAD FINDINGS  Anterior circulation: Both internal carotid arteries are widely patent through the siphon regions. The anterior and middle cerebral vessels are normal without proximal stenosis, aneurysm or vascular malformation.  Posterior circulation: Both vertebral arteries are widely patent to the basilar. No basilar stenosis. Posterior circulation branch vessels are normal.  Venous sinuses: Patent and normal.  Anatomic variants: None  Delayed phase: No abnormal enhancement  Review of the MIP images confirms the above findings  IMPRESSION: Normal examinations. Normal head CT. Normal CT angiography of the upper chest, neck and intracranial vessels.   Electronically Signed   By: Nelson Chimes M.D.   On: 07/17/2018 14:28  Results for orders placed or performed during the hospital encounter of 07/17/18  BUN  Result Value Ref Range   BUN 13 6 - 20 mg/dL  Creatinine,  serum  Result Value Ref Range   Creatinine, Ser 0.93 0.61 - 1.24 mg/dL   GFR calc non Af Amer >60 >60 mL/min   GFR calc Af Amer >60 >60 mL/min      Assessment & Plan:   Problem List Items Addressed This Visit    Essential hypertension - Primary    Now well controlled HTN on low dose med CCB, new diagnosis - Home BP readings normalized  No known complications - question diplopia etiology, still undetermined    Plan:  1. Continue current BP regimen Amlodipine 5mg  daily - ordered 90 day rx for mail order 2. Encourage improved lifestyle - low sodium diet, regular exercise 3. REDUCE frequency - Continue monitor BP outside office, bring readings to next visit, if persistently >140/90 or new symptoms notify office sooner 4. Follow-up as scheduled 2 months for annual physical, labs - if symptomatic or low BP < 100/60 we can consider HALF dose Amlodipine for 2.5mg  dosage      Relevant Medications   amLODipine (NORVASC) 5 MG tablet      Other Visit Diagnoses    Diplopia         Remains uncertain etiology for diplopia, overall symptoms gradual improved Still persistent on provoked eye movement vertical/downard - Less likely related to HTN or vascular circulation, reviewed CTA Head/Neck from ophtho - Does not seem to be vertigo based on history/exam  - He will return to ophthalmology to discuss results and any other considerations - Otherwise my advice was next step if still unresolved is consider 2nd opinion from Neurologist - he will notify me if / when ready for this refer and where  Meds ordered this encounter  Medications  . amLODipine (NORVASC) 5 MG tablet    Sig: Take 1 tablet (5 mg total) by mouth daily.    Dispense:  90 tablet    Refill:  1    Follow up plan: Return if symptoms worsen or fail to improve, for keep apt in May for physical.  Future labs re-ordered for 10/17/18   [name], [facility] 08/08/2018, 1:35 PM

## 2018-09-24 ENCOUNTER — Telehealth: Payer: Self-pay

## 2018-09-24 NOTE — Telephone Encounter (Signed)
Contacted Sarah at [facility] to check on status of referral from Aug 2019.  She stated that patient is in the system, however no office notes were received.  Reviewed chart, Debbie had noted that she contacted Judson Roch as well in regards to this referral.  I have refaxed office notes and colonoscopy/egd report  to [facility] fax # [phone] through [place].  Thanks [employer]

## 2018-09-24 NOTE — Telephone Encounter (Signed)
Patient has been scheduled with Dr. [name] at [facility] on May 14th  At 1:40 pm.  Thanks Sharyn Lull

## 2018-10-15 ENCOUNTER — Other Ambulatory Visit: Payer: Self-pay | Admitting: Family Medicine

## 2018-10-15 DIAGNOSIS — N401 Enlarged prostate with lower urinary tract symptoms: Secondary | ICD-10-CM

## 2018-10-17 ENCOUNTER — Other Ambulatory Visit: Payer: Self-pay

## 2018-10-17 ENCOUNTER — Other Ambulatory Visit: Payer: BLUE CROSS/BLUE SHIELD

## 2018-10-17 DIAGNOSIS — I10 Essential (primary) hypertension: Secondary | ICD-10-CM

## 2018-10-17 DIAGNOSIS — R7303 Prediabetes: Secondary | ICD-10-CM

## 2018-10-17 DIAGNOSIS — E782 Mixed hyperlipidemia: Secondary | ICD-10-CM

## 2018-10-17 DIAGNOSIS — R351 Nocturia: Secondary | ICD-10-CM

## 2018-10-17 DIAGNOSIS — Z0000 Encounter for general adult medical examination without abnormal findings: Secondary | ICD-10-CM

## 2018-10-17 DIAGNOSIS — R748 Abnormal levels of other serum enzymes: Secondary | ICD-10-CM

## 2018-10-17 DIAGNOSIS — E559 Vitamin D deficiency, unspecified: Secondary | ICD-10-CM

## 2018-10-17 DIAGNOSIS — N401 Enlarged prostate with lower urinary tract symptoms: Secondary | ICD-10-CM

## 2018-10-18 ENCOUNTER — Ambulatory Visit: Payer: Self-pay | Admitting: General Surgery

## 2018-10-18 LAB — COMPLETE METABOLIC PANEL WITH GFR
AG Ratio: 2 (calc) (ref 1.0–2.5)
ALT: 37 U/L (ref 9–46)
AST: 25 U/L (ref 10–35)
Albumin: 4.7 g/dL (ref 3.6–5.1)
Alkaline phosphatase (APISO): 80 U/L (ref 35–144)
BUN: 11 mg/dL (ref 7–25)
CO2: 30 mmol/L (ref 20–32)
Calcium: 9.4 mg/dL (ref 8.6–10.3)
Chloride: 105 mmol/L (ref 98–110)
Creat: 1.06 mg/dL (ref 0.70–1.33)
GFR, Est African American: 90 mL/min/{1.73_m2} (ref >=60)
GFR, Est Non African American: 78 mL/min/{1.73_m2} (ref >=60)
Globulin: 2.4 g/dL (calc) (ref 1.9–3.7)
Glucose, Bld: 113 mg/dL — ABNORMAL HIGH (ref 65–99)
Potassium: 4.2 mmol/L (ref 3.5–5.3)
Sodium: 143 mmol/L (ref 135–146)
Total Bilirubin: 1.4 mg/dL — ABNORMAL HIGH (ref 0.2–1.2)
Total Protein: 7.1 g/dL (ref 6.1–8.1)

## 2018-10-18 LAB — CBC WITH DIFFERENTIAL/PLATELET
Absolute Monocytes: 599 cells/uL (ref 200–950)
Basophils Absolute: 51 cells/uL (ref 0–200)
Basophils Relative: 0.7 %
Eosinophils Absolute: 292 cells/uL (ref 15–500)
Eosinophils Relative: 4 %
HCT: 46.1 % (ref 38.5–50.0)
Hemoglobin: 15.8 g/dL (ref 13.2–17.1)
Lymphs Abs: 1497 cells/uL (ref 850–3900)
MCH: 32 pg (ref 27.0–33.0)
MCHC: 34.3 g/dL (ref 32.0–36.0)
MCV: 93.3 fL (ref 80.0–100.0)
MPV: 11.2 fL (ref 7.5–12.5)
Monocytes Relative: 8.2 %
Neutro Abs: 4862 cells/uL (ref 1500–7800)
Neutrophils Relative %: 66.6 %
Platelets: 180 10*3/uL (ref 140–400)
RBC: 4.94 10*6/uL (ref 4.20–5.80)
RDW: 11.9 % (ref 11.0–15.0)
Total Lymphocyte: 20.5 %
WBC: 7.3 10*3/uL (ref 3.8–10.8)

## 2018-10-18 LAB — LIPID PANEL
Cholesterol: 191 mg/dL (ref <200)
HDL: 40 mg/dL (ref >=40)
LDL Cholesterol (Calc): 126 mg/dL (calc) — ABNORMAL HIGH
Non-HDL Cholesterol (Calc): 151 mg/dL (calc) — ABNORMAL HIGH (ref <130)
Total CHOL/HDL Ratio: 4.8 (calc) (ref <5.0)
Triglycerides: 132 mg/dL (ref <150)

## 2018-10-18 LAB — HEMOGLOBIN A1C
Hgb A1c MFr Bld: 5.4 % of total Hgb (ref <5.7)
Mean Plasma Glucose: 108 (calc)
eAG (mmol/L): 6 (calc)

## 2018-10-18 LAB — VITAMIN D 25 HYDROXY (VIT D DEFICIENCY, FRACTURES): Vit D, 25-Hydroxy: 46 ng/mL (ref 30–100)

## 2018-10-18 LAB — TSH: TSH: 2.46 mIU/L (ref 0.40–4.50)

## 2018-10-18 LAB — PSA: PSA: 0.7 ng/mL (ref <=4.0)

## 2018-10-18 NOTE — H&P (Signed)
History of Present Illness Jonathan Ruff MD; 12/16/4578 1:31 PM) The patient is a 58 year old male who presents with anal lesions. 58 year old male who presents to the office for evaluation of an anal polyp. This was seen on colonoscopy in August 2019. Patient states he has not noticed this before. He does state that he has a history of something resected from his anal canal approximately 25 years ago by a Psychologist, sport and exercise in [place].   Past Surgical History Jonathan Ortega, [name]; 10/18/2018 1:28 PM) Anal Fissure Repair   Diagnostic Studies History Jonathan Ortega, CMA; 10/18/2018 1:28 PM) Colonoscopy  within last year  Allergies Jonathan Ortega, CMA; 10/18/2018 1:28 PM) No Known Drug Allergies [10/18/2018]: Allergies Reconciled   Medication History Jonathan Ortega, CMA; 10/18/2018 1:29 PM) amLODIPine Besylate (5MG  Tablet, Oral) Active. Omeprazole (20MG  Capsule DR, Oral) Active. Silodosin (8MG  Capsule, Oral) Active. Aspirin (81MG  Capsule, Oral) Active. Loratadine (10MG  Tablet, Oral) Active. Medications Reconciled  Social History Jonathan Ortega, CMA; 10/18/2018 1:28 PM) Alcohol use  Occasional alcohol use. Caffeine use  Carbonated beverages, Coffee. No drug use  Tobacco use  Never smoker.  Family History Jonathan Ortega, [place]; 10/18/2018 1:28 PM) Ovarian Cancer  Mother.  Other Problems Jonathan Ortega, CMA; 10/18/2018 1:28 PM) Enlarged Prostate  Hemorrhoids  High blood pressure     Review of Systems Jonathan Ortega CMA; 10/18/2018 1:28 PM) General Not Present- Appetite Loss, Chills, Fatigue, Fever, Night Sweats, Weight Gain and Weight Loss. Skin Not Present- Change in Wart/Mole, Dryness, Hives, Jaundice, New Lesions, Non-Healing Wounds, Rash and Ulcer. HEENT Present- Seasonal Allergies, Visual Disturbances and Wears glasses/contact lenses. Not Present- Earache, Hearing Loss, Hoarseness, Nose Bleed, Oral Ulcers, Ringing in the Ears, Sinus Pain, Sore Throat and Yellow  Eyes. Respiratory Present- Snoring. Not Present- Bloody sputum, Chronic Cough, Difficulty Breathing and Wheezing. Cardiovascular Not Present- Chest Pain, Difficulty Breathing Lying Down, Leg Cramps, Palpitations, Rapid Heart Rate, Shortness of Breath and Swelling of Extremities. Gastrointestinal Present- Hemorrhoids and Indigestion. Not Present- Abdominal Pain, Bloating, Bloody Stool, Change in Bowel Habits, Chronic diarrhea, Constipation, Difficulty Swallowing, Excessive gas, Gets full quickly at meals, Nausea, Rectal Pain and Vomiting. Male Genitourinary Present- Nocturia. Not Present- Blood in Urine, Change in Urinary Stream, Frequency, Impotence, Painful Urination, Urgency and Urine Leakage. Musculoskeletal Not Present- Back Pain, Joint Pain, Joint Stiffness, Muscle Pain, Muscle Weakness and Swelling of Extremities. Neurological Not Present- Decreased Memory, Fainting, Headaches, Numbness, Seizures, Tingling, Tremor, Trouble walking and Weakness. Psychiatric Not Present- Anxiety, Bipolar, Change in Sleep Pattern, Depression, Fearful and Frequent crying. Endocrine Not Present- Cold Intolerance, Excessive Hunger, Hair Changes, Heat Intolerance, Hot flashes and New Diabetes. Hematology Not Present- Blood Thinners, Easy Bruising, Excessive bleeding, Gland problems, HIV and Persistent Infections.  Vitals Jonathan Ortega CMA; 10/18/2018 1:30 PM) 10/18/2018 1:29 PM Weight: 195.38 lb Height: 69in Body Surface Area: 2.05 m Body Mass Index: 28.85 kg/m  Temp.: 99.48F(Oral)  Pulse: 85 (Regular)  BP: 126/82 (Sitting, Left Arm, Standard)       Physical Exam Jonathan Ruff MD; 9/98/3382 1:38 PM) General Mental Status-Alert. General Appearance-Not in acute distress. Build & Nutrition-Well nourished. Posture-Normal posture. Gait-Normal.  Head and Neck Head-normocephalic, atraumatic with no lesions or palpable masses. Trachea-midline.  Chest and Lung Exam Chest and  lung exam reveals -on auscultation, normal breath sounds, no adventitious sounds and normal vocal resonance.  Cardiovascular Cardiovascular examination reveals -normal heart sounds, regular rate and rhythm with no murmurs and no digital clubbing, cyanosis, edema, increased warmth or tenderness.  Abdomen Inspection Inspection of  the abdomen reveals - No Hernias. Palpation/Percussion Palpation and Percussion of the abdomen reveal - Soft, Non Tender, No Rigidity (guarding), No hepatosplenomegaly and No Palpable abdominal masses.  Rectal Anorectal Exam External - normal external exam. Internal - normal internal exam.  Neurologic Neurologic evaluation reveals -alert and oriented x 3 with no impairment of recent or remote memory, normal attention span and ability to concentrate, normal sensation and normal coordination.  Musculoskeletal Normal Exam - Bilateral-Upper Extremity Strength Normal and Lower Extremity Strength Normal.   Results Jonathan Ruff MD; 8/52/7782 1:40 PM) Procedures  Name Value Date ANOSCOPY, DIAGNOSTIC (42353) [ Hemorrhoids ] Procedure Other: Procedure: Anoscopy....Marland KitchenMarland KitchenSurgeon: Jonathan Ortega....Marland KitchenMarland KitchenAfter the risks and benefits were explained, verbal consent was obtained for above procedure. A medical assistant chaperone was present thoroughout the entire procedure. 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 It is rather enlarged and I have recommended excision. I believe this would be best to be done under anesthesia. We have discussed this in detail. All questions were answered. Risks include bleeding, pain and recurrence.

## 2018-10-23 ENCOUNTER — Ambulatory Visit (INDEPENDENT_AMBULATORY_CARE_PROVIDER_SITE_OTHER): Payer: BLUE CROSS/BLUE SHIELD | Admitting: Family Medicine

## 2018-10-23 ENCOUNTER — Other Ambulatory Visit: Payer: Self-pay

## 2018-10-23 ENCOUNTER — Other Ambulatory Visit: Payer: Self-pay | Admitting: Family Medicine

## 2018-10-23 ENCOUNTER — Encounter: Payer: Self-pay | Admitting: Family Medicine

## 2018-10-23 VITALS — BP 134/75 | HR 65 | Temp 98.3°F | Resp 16 | Ht 69.0 in | Wt 196.0 lb

## 2018-10-23 DIAGNOSIS — I10 Essential (primary) hypertension: Secondary | ICD-10-CM

## 2018-10-23 DIAGNOSIS — R351 Nocturia: Secondary | ICD-10-CM

## 2018-10-23 DIAGNOSIS — R7303 Prediabetes: Secondary | ICD-10-CM

## 2018-10-23 DIAGNOSIS — E782 Mixed hyperlipidemia: Secondary | ICD-10-CM

## 2018-10-23 DIAGNOSIS — Z0000 Encounter for general adult medical examination without abnormal findings: Secondary | ICD-10-CM | POA: Diagnosis not present

## 2018-10-23 DIAGNOSIS — E559 Vitamin D deficiency, unspecified: Secondary | ICD-10-CM

## 2018-10-23 DIAGNOSIS — K219 Gastro-esophageal reflux disease without esophagitis: Secondary | ICD-10-CM

## 2018-10-23 DIAGNOSIS — N401 Enlarged prostate with lower urinary tract symptoms: Secondary | ICD-10-CM

## 2018-10-23 MED ORDER — SILODOSIN 8 MG PO CAPS: 8.0000 mg | ORAL_CAPSULE | Freq: Every day | ORAL | 3 refills | Status: DC

## 2018-10-23 MED ORDER — OMEPRAZOLE 20 MG PO CPDR: 20.0000 mg | DELAYED_RELEASE_CAPSULE | Freq: Every day | ORAL | 3 refills | Status: DC

## 2018-10-23 MED ORDER — AMLODIPINE BESYLATE 5 MG PO TABS: 5.0000 mg | ORAL_TABLET | Freq: Every day | ORAL | 3 refills | Status: DC

## 2018-10-23 NOTE — Assessment & Plan Note (Signed)
Mild abnormal cholesterol not on statin - improved profile HDL, slight inc LDL Last lipid panel 10/2018 Calculated ASCVD 10 yr risk score 6.3%  Plan: 1. Discussion of ASCVD risk reduction statin vs ASA and review guidelines on ASA use 2. Ultimately agree to continue ASA 81mg  for primary ASCVD risk reduction now at age 58 with low bleeding risk, may continue into age 33s - possibly switch to Statin in future 3. Encourage improved lifestyle - low carb/cholesterol, reduce portion size, continue improving regular exercise 4. Follow-up yearly lipid

## 2018-10-23 NOTE — Assessment & Plan Note (Addendum)
Stable chronic GERD without complication. - No GI red flag symptoms - Chronic PPI use, low dose - S/p last EGD updated 01/2018 per [facility], see report Fam history of esophageal cancer OFF Zantac recall  Plan: 1. Continue Omeprazole 20mg  daily - re order 2. Diet modifications reduce GERD

## 2018-10-23 NOTE — Progress Notes (Signed)
Subjective:    Patient ID: Jonathan Ortega, male    DOB: 1960/10/04, 58 y.o.   MRN: [id number]  Jonathan Ortega is a 58 y.o. male presenting on 10/23/2018 for Annual Exam   HPI   Here for Annual Physical and Lab Review.  FOLLOW-UPGERD: Last EGD 01/05/18 done by [facility]. Showed gastrc polyps negative and some mild erythema. He was on H2 Zantac but then recalled, now he remains on PPI Omeprazole 20mg  daily doing well, needs refill Mother's sidewith maternal uncleof family with esophageal cancer  Pre-Diabetes Last A1c on lab 5.4, improved prior 5.5 - Never on medication Lifestyle: - Weight stable - Diet: Improving reduce sugar / lower carb healthy diet, rare soda, drinks unsweet tea, water, coffee - Exercise: Walking - No significant family history of DM Denies hypoglycemia  CHRONIC HTN: Reports no new concerns. BP readings normal Current Meds - Amlodipine 5mg  daily   Reports good compliance, took meds today. Tolerating well, w/o complaints.  HYPERLIPIDEMIA: - Reports no concerns. Last lipids 10/2018,with improved HDL and mild elevated LDL - Taking ASA 81mg  daily for primary prevention. No known CAD / MI / CVA - Not taking any medicines OTC or rx for cholesterol. Never on statin.  Elevated LFTs / Elevated Bilirubin - Reports chronic history of elevated LFTs, has been told this for >20 years by his report, no prior ultrasound or other imaging test to diagnose this. - Today has no new concerns, lab shows still mild elevated T Bilirubin  BPH with LUTS,controlled - Reports chronic problem for >6 years. Taking generic silodosin 8mg  daily good results, previously discussed he was on brand rapaflo with some good results, due to cost switched, has maintained on this generic now with good results. Still has mild nocturia 1-2 times at night and some slight inc urgency at times otherwise reduced urinary frequency, still has some reduced strength to urinary stream at end.  Vitamin D  deficiency Lab shows normal result. Continues onVitamin D3 1,000 daily   Health Maintenance  - Prostate cancer screening: No known personal or family history, last PSA 0.7 (10/2018), last DRE reported prostate was only mildly enlarged, see above BPH LUTS  - Colonoscopy last done 01/05/18 (Dr Bonna Gains [facility]) multiple polyp benign repeat 5 years, there was one polyp unable to retreive will need surgical resection, TBD currently scheduling.  - he has family history of esophageal cancer   Depression screen [facility] 2/9 10/23/2018 08/08/2018 07/09/2018  Decreased Interest 0 0 0  Down, Depressed, Hopeless 0 0 0  PHQ - 2 Score 0 0 0    Past Medical History:  Diagnosis Date  . Allergy   . GERD (gastroesophageal reflux disease)   . Hypertension    Past Surgical History:  Procedure Laterality Date  . COLONOSCOPY WITH PROPOFOL N/A 01/05/2018   Procedure: COLONOSCOPY WITH PROPOFOL;  Surgeon: Virgel Manifold, MD;  Location: [facility] ENDOSCOPY;  Service: Endoscopy;  Laterality: N/A;  . ESOPHAGOGASTRODUODENOSCOPY (EGD) WITH PROPOFOL N/A 01/05/2018   Procedure: ESOPHAGOGASTRODUODENOSCOPY (EGD) WITH PROPOFOL;  Surgeon: Virgel Manifold, MD;  Location: [facility] ENDOSCOPY;  Service: Endoscopy;  Laterality: N/A;   Social History   Socioeconomic History  . Marital status: Single    Spouse name: Not on file  . Number of children: Not on file  . Years of education: Not on file  . Highest education level: Not on file  Occupational History  . Not on file  Social Needs  . Financial resource strain: Not on file  .  Food insecurity:    Worry: Not on file    Inability: Not on file  . Transportation needs:    Medical: Not on file    Non-medical: Not on file  Tobacco Use  . Smoking status: Never Smoker  . Smokeless tobacco: Never Used  Substance and Sexual Activity  . Alcohol use: Yes    Alcohol/week: 0.0 standard drinks    Comment: have not had a drink since last month   . Drug use: No  . Sexual  activity: Not on file  Lifestyle  . Physical activity:    Days per week: Not on file    Minutes per session: Not on file  . Stress: Not on file  Relationships  . Social connections:    Talks on phone: Not on file    Gets together: Not on file    Attends religious service: Not on file    Active member of club or organization: Not on file    Attends meetings of clubs or organizations: Not on file    Relationship status: Not on file  . Intimate partner violence:    Fear of current or ex partner: Not on file    Emotionally abused: Not on file    Physically abused: Not on file    Forced sexual activity: Not on file  Other Topics Concern  . Not on file  Social History Narrative  . Not on file   Family History  Problem Relation Age of Onset  . Hypertension Paternal Uncle   . Hypertension Maternal Grandmother   . Ovarian cancer Mother   . Esophageal cancer Maternal Uncle    Current Outpatient Medications on File Prior to Visit  Medication Sig  . aspirin EC 81 MG tablet Take 81 mg by mouth daily.  . cholecalciferol (VITAMIN D) 1000 UNITS tablet Take 1,000 Units by mouth daily.  . hydrocortisone (ANUSOL-HC) 25 MG suppository Place 1 suppository (25 mg total) rectally 2 (two) times daily as needed.  [name] ibuprofen (ADVIL,MOTRIN) 200 MG tablet Take 200 mg by mouth every 6 (six) hours as needed.  . loratadine (CLARITIN) 10 MG tablet Take 10 mg by mouth daily.  . Multiple Vitamin (MULTIVITAMIN) tablet Take 1 tablet by mouth daily.  . Potassium 99 MG TABS Take by mouth.  . psyllium (METAMUCIL) 58.6 % packet Take 1 packet by mouth daily.   No current facility-administered medications on file prior to visit.     Review of Systems  Constitutional: Negative for activity change, appetite change, chills, diaphoresis, fatigue and fever.  HENT: Negative for congestion and hearing loss.   Eyes: Negative for visual disturbance.  Respiratory: Negative for apnea, cough, chest tightness, shortness  of breath and wheezing.   Cardiovascular: Negative for chest pain, palpitations and leg swelling.  Gastrointestinal: Negative for abdominal pain, anal bleeding, blood in stool, constipation, diarrhea, nausea and vomiting.  Endocrine: Negative for cold intolerance.  Genitourinary: Negative for decreased urine volume, difficulty urinating, dysuria, frequency, hematuria and urgency.       Nocturia  Musculoskeletal: Negative for arthralgias, back pain and neck pain.  Skin: Negative for rash.  Allergic/Immunologic: Negative for environmental allergies.  Neurological: Negative for dizziness, weakness, light-headedness, numbness and headaches.  Hematological: Negative for adenopathy.  Psychiatric/Behavioral: Negative for behavioral problems, dysphoric mood and sleep disturbance. The patient is not nervous/anxious.    Per HPI unless specifically indicated above      Objective:    BP 134/75   Pulse 65   Temp 98.3  F (36.8 C) (Oral)   Resp 16   Ht 5\' 9"  (1.753 m)   Wt 196 lb (88.9 kg)   BMI 28.94 kg/m   Wt Readings from Last 3 Encounters:  10/23/18 196 lb (88.9 kg)  08/08/18 196 lb (88.9 kg)  07/09/18 196 lb 9.6 oz (89.2 kg)    Physical Exam Vitals signs and nursing note reviewed.  Constitutional:      General: He is not in acute distress.    Appearance: He is well-developed. He is not diaphoretic.     Comments: Well-appearing, comfortable, cooperative  HENT:     Head: Normocephalic and atraumatic.     Comments: Frontal / maxillary sinuses non-tender. Bilateral TMs clear without erythema, effusion or bulging. Eyes:     General:        Right eye: No discharge.        Left eye: No discharge.     Conjunctiva/sclera: Conjunctivae normal.     Pupils: Pupils are equal, round, and reactive to light.  Neck:     Musculoskeletal: Normal range of motion and neck supple.     Thyroid: No thyromegaly.  Cardiovascular:     Rate and Rhythm: Normal rate and regular rhythm.     Heart sounds:  Normal heart sounds. No murmur.  Pulmonary:     Effort: Pulmonary effort is normal. No respiratory distress.     Breath sounds: Normal breath sounds. No wheezing or rales.  Abdominal:     General: Bowel sounds are normal. There is no distension.     Palpations: Abdomen is soft. There is no mass.     Tenderness: There is no abdominal tenderness.  Musculoskeletal: Normal range of motion.        General: No tenderness.     Comments: Upper / Lower Extremities: - Normal muscle tone, strength bilateral upper extremities 5/5, lower extremities 5/5  Lymphadenopathy:     Cervical: No cervical adenopathy.  Skin:    General: Skin is warm and dry.     Findings: No erythema or rash.  Neurological:     Mental Status: He is alert and oriented to person, place, and time.     Comments: Distal sensation intact to light touch all extremities  Psychiatric:        Behavior: Behavior normal.     Comments: Well groomed, good eye contact, normal speech and thoughts       Results for orders placed or performed in visit on 10/17/18  TSH  Result Value Ref Range   TSH 2.46 0.40 - 4.50 mIU/L  VITAMIN D 25 Hydroxy (Vit-D Deficiency, Fractures)  Result Value Ref Range   Vit D, 25-Hydroxy 46 30 - 100 ng/mL  PSA  Result Value Ref Range   PSA 0.7 < OR = 4.0 ng/mL  Lipid panel  Result Value Ref Range   Cholesterol 191 <200 mg/dL   HDL 40 > OR = 40 mg/dL   Triglycerides 132 <150 mg/dL   LDL Cholesterol (Calc) 126 (H) mg/dL (calc)   Total CHOL/HDL Ratio 4.8 <5.0 (calc)   Non-HDL Cholesterol (Calc) 151 (H) <130 mg/dL (calc)  COMPLETE METABOLIC PANEL WITH GFR  Result Value Ref Range   Glucose, Bld 113 (H) 65 - 99 mg/dL   BUN 11 7 - 25 mg/dL   Creat 1.06 0.70 - 1.33 mg/dL   GFR, Est Non African American 78 > OR = 60 mL/min/1.13m2   GFR, Est African American 90 > OR = 60 mL/min/1.5m2  BUN/Creatinine Ratio NOT APPLICABLE 6 - 22 (calc)   Sodium 143 135 - 146 mmol/L   Potassium 4.2 3.5 - 5.3 mmol/L    Chloride 105 98 - 110 mmol/L   CO2 30 20 - 32 mmol/L   Calcium 9.4 8.6 - 10.3 mg/dL   Total Protein 7.1 6.1 - 8.1 g/dL   Albumin 4.7 3.6 - 5.1 g/dL   Globulin 2.4 1.9 - 3.7 g/dL (calc)   AG Ratio 2.0 1.0 - 2.5 (calc)   Total Bilirubin 1.4 (H) 0.2 - 1.2 mg/dL   Alkaline phosphatase (APISO) 80 35 - 144 U/L   AST 25 10 - 35 U/L   ALT 37 9 - 46 U/L  CBC with Differential/Platelet  Result Value Ref Range   WBC 7.3 3.8 - 10.8 Thousand/uL   RBC 4.94 4.20 - 5.80 Million/uL   Hemoglobin 15.8 13.2 - 17.1 g/dL   HCT 46.1 38.5 - 50.0 %   MCV 93.3 80.0 - 100.0 fL   MCH 32.0 27.0 - 33.0 pg   MCHC 34.3 32.0 - 36.0 g/dL   RDW 11.9 11.0 - 15.0 %   Platelets 180 140 - 400 Thousand/uL   MPV 11.2 7.5 - 12.5 fL   Neutro Abs 4,862 1,500 - 7,800 cells/uL   Lymphs Abs 1,497 850 - 3,900 cells/uL   Absolute Monocytes 599 200 - 950 cells/uL   Eosinophils Absolute 292 15 - 500 cells/uL   Basophils Absolute 51 0 - 200 cells/uL   Neutrophils Relative % 66.6 %   Total Lymphocyte 20.5 %   Monocytes Relative 8.2 %   Eosinophils Relative 4.0 %   Basophils Relative 0.7 %  Hemoglobin A1c  Result Value Ref Range   Hgb A1c MFr Bld 5.4 <5.7 % of total Hgb   Mean Plasma Glucose 108 (calc)   eAG (mmol/L) 6.0 (calc)      Assessment & Plan:   Problem List Items Addressed This Visit    BPH (benign prostatic hyperplasia)    Stable chronic BPH with controlled LUTS, w/o obstruction - AUA BPH score has been stable now 4-6 score - Last PSA 0.7 (10/2018) - prior 0.5 to 0.7 - Last DRE 2017, reported normal - No known personal/family history of prostate CA  Plan: 1. Continue generic Silodosin 8mg  daily, sent refills 2. Reviewed low AUA BPH score < 8, likely not covered for cialis for daily use BPH, consider future trial off Rapaflo may see if can try cialis in future if interested 3 Yearly PSA 4. Follow-up as needed, future consider alternative Urology referral [facility] vs [facility] if needed      Relevant  Medications   silodosin (RAPAFLO) 8 MG CAPS capsule   Essential hypertension    Controlled HTN - Home BP readings normal No known complications - question diplopia etiology, still undetermined    Plan:  1. Continue current BP regimen Amlodipine 5mg  daily 2. Encourage improved lifestyle - low sodium diet, regular exercise 3. Continue monitor BP outside office, bring readings to next visit, if persistently >140/90 or new symptoms notify office sooner      Relevant Medications   amLODipine (NORVASC) 5 MG tablet   GERD (gastroesophageal reflux disease)    Stable chronic GERD without complication. - No GI red flag symptoms - Chronic PPI use, low dose - S/p last EGD updated 01/2018 per [facility], see report Fam history of esophageal cancer OFF Zantac recall  Plan: 1. Continue Omeprazole 20mg  daily - re order 2. Diet modifications reduce GERD  Relevant Medications   omeprazole (PRILOSEC) 20 MG capsule   Hyperlipidemia    Mild abnormal cholesterol not on statin - improved profile HDL, slight inc LDL Last lipid panel 10/2018 Calculated ASCVD 10 yr risk score 6.3%  Plan: 1. Discussion of ASCVD risk reduction statin vs ASA and review guidelines on ASA use 2. Ultimately agree to continue ASA 81mg  for primary ASCVD risk reduction now at age 61 with low bleeding risk, may continue into age 29s - possibly switch to Statin in future 3. Encourage improved lifestyle - low carb/cholesterol, reduce portion size, continue improving regular exercise 4. Follow-up yearly lipid      Relevant Medications   amLODipine (NORVASC) 5 MG tablet   Pre-diabetes    Well-controlled Pre-DM with A1c 5.4 Concern with obesity  Plan:  1. Not on any therapy currently - remain off 2. Encourage improved lifestyle - low carb, low sugar diet, reduce portion size, continue improving regular exercise 3. Follow-up 1 year for annual + A1c      Vitamin D deficiency    Last lab Vitamin D3 >40 Continue Vit D 3 1,000  daily maintenance       Other Visit Diagnoses    Annual physical exam    -  Primary      Updated Health Maintenance information Reviewed recent lab results with patient Encouraged improvement to lifestyle with diet and exercise - Goal of weight loss   Meds ordered this encounter  Medications  . omeprazole (PRILOSEC) 20 MG capsule    Sig: Take 1 capsule (20 mg total) by mouth daily before breakfast.    Dispense:  90 capsule    Refill:  3  . amLODipine (NORVASC) 5 MG tablet    Sig: Take 1 tablet (5 mg total) by mouth daily.    Dispense:  90 tablet    Refill:  3  . silodosin (RAPAFLO) 8 MG CAPS capsule    Sig: Take 1 capsule (8 mg total) by mouth daily with breakfast.    Dispense:  90 capsule    Refill:  3    Follow up plan: Return in about 1 year (around 10/23/2019) for Annual Physical.  Future labs ordered 10/21/19  [name], [facility] 10/23/2018, 9:09 AM

## 2018-10-23 NOTE — Assessment & Plan Note (Signed)
Well-controlled Pre-DM with A1c 5.4 Concern with obesity  Plan:  1. Not on any therapy currently - remain off 2. Encourage improved lifestyle - low carb, low sugar diet, reduce portion size, continue improving regular exercise 3. Follow-up 1 year for annual + A1c

## 2018-10-23 NOTE — Patient Instructions (Addendum)
Thank you for coming to the office today.  Last colonoscopy 01/05/18 - next due in 5 years.  TBD polyp surgery  Refilled all 3 rx meds to [facility] for 90 day and refills for up to 1 year  DUE for FASTING BLOOD WORK (no food or drink after midnight before the lab appointment, only water or coffee without cream/sugar on the morning of)  SCHEDULE "Lab Only" visit in the morning at the clinic for lab draw in 1 YEAR  - Make sure Lab Only appointment is at about 1 week before your next appointment, so that results will be available  For Lab Results, once available within 2-3 days of blood draw, you can can log in to MyChart online to view your results and a brief explanation. Also, we can discuss results at next follow-up visit.   Please schedule a Follow-up Appointment to: Return in about 1 year (around 10/23/2019) for Annual Physical.  If you have any other questions or concerns, please feel free to call the office or send a message through [place]. You may also schedule an earlier appointment if necessary.  Additionally, you may be receiving a survey about your experience at our office within a few days to 1 week by e-mail or mail. We value your feedback.  [name], DO [place]

## 2018-10-23 NOTE — Assessment & Plan Note (Signed)
Stable chronic BPH with controlled LUTS, w/o obstruction - AUA BPH score has been stable now 4-6 score - Last PSA 0.7 (10/2018) - prior 0.5 to 0.7 - Last DRE 2017, reported normal - No known personal/family history of prostate CA  Plan: 1. Continue generic Silodosin 8mg  daily, sent refills 2. Reviewed low AUA BPH score < 8, likely not covered for cialis for daily use BPH, consider future trial off Rapaflo may see if can try cialis in future if interested 3 Yearly PSA 4. Follow-up as needed, future consider alternative Urology referral [facility] vs [facility] if needed

## 2018-10-23 NOTE — Assessment & Plan Note (Signed)
Last lab Vitamin D3 >40 Continue Vit D 3 1,000 daily maintenance

## 2018-10-23 NOTE — Assessment & Plan Note (Signed)
Controlled HTN - Home BP readings normal No known complications - question diplopia etiology, still undetermined    Plan:  1. Continue current BP regimen Amlodipine 5mg  daily 2. Encourage improved lifestyle - low sodium diet, regular exercise 3. Continue monitor BP outside office, bring readings to next visit, if persistently >140/90 or new symptoms notify office sooner

## 2018-11-06 ENCOUNTER — Telehealth: Payer: Self-pay | Admitting: Family Medicine

## 2018-11-06 DIAGNOSIS — K640 First degree hemorrhoids: Secondary | ICD-10-CM

## 2018-11-06 MED ORDER — HYDROCORTISONE (PERIANAL) 2.5 % EX CREA: TOPICAL_CREAM | Freq: Two times a day (BID) | CUTANEOUS | 1 refills | Status: DC | PRN

## 2018-11-06 MED ORDER — HYDROCORTISONE ACETATE 25 MG RE SUPP: 25.0000 mg | Freq: Two times a day (BID) | RECTAL | 2 refills | Status: DC

## 2018-11-06 NOTE — Telephone Encounter (Signed)
Pt  Called requesting refill on hydrocoririsone 25 mg called into  [facility]

## 2018-11-06 NOTE — Telephone Encounter (Signed)
The pt prefer the anal suppository . He said just send the prescription over and he will pay out of pocket.

## 2018-11-06 NOTE — Telephone Encounter (Signed)
Sent suppository.  [name], DO [facility] 11/06/2018, 4:53 PM

## 2018-11-06 NOTE — Telephone Encounter (Signed)
Suppository are not preferred anymore. He will need to be changed to topical cream, new rx sent to [facility].  [name], DO [facility] 11/06/2018, 12:43 PM

## 2018-11-07 NOTE — Telephone Encounter (Signed)
Received PA. Declined to complete. Patient to pay out of pocket.  [name], DO [facility] 11/07/2018, 4:41 PM

## 2018-11-23 ENCOUNTER — Ambulatory Visit
Admission: EM | Admit: 2018-11-23 | Discharge: 2018-11-23 | Disposition: A | Discharge status: Home / Self Care | Payer: BC Managed Care – PPO | Attending: Urgent Care | Admitting: Urgent Care

## 2018-11-23 ENCOUNTER — Encounter: Payer: Self-pay | Admitting: Emergency Medicine

## 2018-11-23 ENCOUNTER — Other Ambulatory Visit: Payer: Self-pay

## 2018-11-23 DIAGNOSIS — L509 Urticaria, unspecified: Secondary | ICD-10-CM | POA: Diagnosis not present

## 2018-11-23 MED ORDER — PREDNISONE 10 MG (21) PO TBPK: ORAL_TABLET | Freq: Every day | ORAL | 0 refills | Status: DC

## 2018-11-23 NOTE — Discharge Instructions (Addendum)
It was very nice seeing you today in clinic. Thank you for entrusting me with your care.   Please utilize the medications that we discussed. Your prescriptions have been called in to your pharmacy. Additionally, use diphenhydramine for the itching per package instructions. Remember it will may you sleepy.   Make arrangements to follow up with your regular doctor in 1 week for re-evaluation. If your symptoms/condition worsens, please seek follow up care either here or in the ER. Please remember, our [place] providers are "right here with you" when you need [place].   Again, it was my pleasure to take care of you today. Thank you for choosing our clinic. I hope that you start to feel better quickly.   Honor Loh, MSN, APRN, FNP-C, CEN Advanced Practice Provider [facility]

## 2018-11-23 NOTE — ED Provider Notes (Signed)
[address] [place], [place] [address] [phone]   Name: Jonathan Ortega DOB: [birth year] MRN: [id number] CSN: [id number] PCP: [name], DO  Arrival date and time:  11/23/18 0820  Chief Complaint:  Urticaria  NOTE: Prior to seeing the patient today, I have reviewed the triage nursing documentation and vital signs. Clinical staff has updated patient's PMH/PSHx, current medication list, and drug allergies/intolerances to ensure comprehensive history available to assist in medical decision making.   History:   HPI: Jonathan Ortega is a 58 y.o. male who presents today with complaints of urticaria to his hands, upper extremities, anterior torso, and feet that initially declared yesterday morning. Rash started on his hands and has progressively spread. Patient denies any new medications, foods, soaps/body washes, laundry detergents, or cosmetics. He denies a past medical history significant for environmental allergies. He advises that he has never developed a skin eruption like this in the past. Rash is erythematous and pruritic. He has not appreciated any drainage associated with the rash. There is no facial involvement; no rash to periorbital, paranasal, or perioral areas. Patient denies that he is experiencing any shortness of breath or sensation of pharyngeal/laryngeal fullness.  In efforts to help reduce/relieve the associated pruritis, the patient advising that he has used diphenhydramine at home.  Patient took diphenhydramine 25 mg twice during the day yesterday, then 50 mg at bedtime. Patient notes that conservative symptomatic management at home "helped some" initially, however he woke at 4:00 this morning with severe itching and was unable to get back to sleep thus prompting his visit to urgent care today.  Past Medical History:  Diagnosis Date  . Allergy   . GERD (gastroesophageal reflux disease)   . Hypertension     Past Surgical History:  Procedure  Laterality Date  . COLONOSCOPY WITH PROPOFOL N/A 01/05/2018   Procedure: COLONOSCOPY WITH PROPOFOL;  Surgeon: Virgel Manifold, MD;  Location: [facility] ENDOSCOPY;  Service: Endoscopy;  Laterality: N/A;  . ESOPHAGOGASTRODUODENOSCOPY (EGD) WITH PROPOFOL N/A 01/05/2018   Procedure: ESOPHAGOGASTRODUODENOSCOPY (EGD) WITH PROPOFOL;  Surgeon: Virgel Manifold, MD;  Location: [facility] ENDOSCOPY;  Service: Endoscopy;  Laterality: N/A;    Family History  Problem Relation Age of Onset  . Hypertension Paternal Uncle   . Hypertension Maternal Grandmother   . Ovarian cancer Mother   . Esophageal cancer Maternal Uncle     Social History   Socioeconomic History  . Marital status: Single    Spouse name: Not on file  . Number of children: Not on file  . Years of education: Not on file  . Highest education level: Not on file  Occupational History  . Not on file  Social Needs  . Financial resource strain: Not on file  . Food insecurity    Worry: Not on file    Inability: Not on file  . Transportation needs    Medical: Not on file    Non-medical: Not on file  Tobacco Use  . Smoking status: Never Smoker  . Smokeless tobacco: Never Used  Substance and Sexual Activity  . Alcohol use: Yes    Alcohol/week: 0.0 standard drinks    Comment: have not had a drink since last month   . Drug use: No  . Sexual activity: Not on file  Lifestyle  . Physical activity    Days per week: Not on file    Minutes per session: Not on file  . Stress: Not on file  Relationships  . Social connections  Talks on phone: Not on file    Gets together: Not on file    Attends religious service: Not on file    Active member of club or organization: Not on file    Attends meetings of clubs or organizations: Not on file    Relationship status: Not on file  . Intimate partner violence    Fear of current or ex partner: Not on file    Emotionally abused: Not on file    Physically abused: Not on file    Forced sexual  activity: Not on file  Other Topics Concern  . Not on file  Social History Narrative  . Not on file    Patient Active Problem List   Diagnosis Date Noted  . Essential hypertension 07/09/2018  . Rectal tumor   . Diverticulosis of large intestine without diverticulitis   . Benign neoplasm of descending colon   . Benign neoplasm of ascending colon   . Columnar epithelial-lined lower esophagus   . Gastric polyp   . Stomach irritation   . Heartburn   . Hyperlipidemia 10/27/2016  . Special screening for malignant neoplasms, colon 10/27/2016  . Hemorrhoids 04/25/2016  . Pre-diabetes 10/19/2015  . Elevated liver enzymes 10/19/2015  . Lateral epicondylitis of right elbow 10/19/2015  . GERD (gastroesophageal reflux disease) 04/20/2015  . H/O seasonal allergies 04/20/2015  . Vitamin D deficiency 04/20/2015  . BPH (benign prostatic hyperplasia) 04/20/2015    Home Medications:    Current Meds  Medication Sig  . amLODipine (NORVASC) 5 MG tablet Take 1 tablet (5 mg total) by mouth daily.  [name] aspirin EC 81 MG tablet Take 81 mg by mouth daily.  . cholecalciferol (VITAMIN D) 1000 UNITS tablet Take 1,000 Units by mouth daily.  [name] ibuprofen (ADVIL,MOTRIN) 200 MG tablet Take 200 mg by mouth every 6 (six) hours as needed.  . loratadine (CLARITIN) 10 MG tablet Take 10 mg by mouth daily.  . Multiple Vitamin (MULTIVITAMIN) tablet Take 1 tablet by mouth daily.  [name] omeprazole (PRILOSEC) 20 MG capsule Take 1 capsule (20 mg total) by mouth daily before breakfast.  . Potassium 99 MG TABS Take by mouth.  . silodosin (RAPAFLO) 8 MG CAPS capsule Take 1 capsule (8 mg total) by mouth daily with breakfast.    Allergies:   Patient has no known allergies.  Review of Systems (ROS): Review of Systems  Constitutional: Negative for chills and fever.  Respiratory: Negative for cough and shortness of breath.   Cardiovascular: Negative for chest pain and palpitations.  Skin: Positive for rash.   Allergic/Immunologic: Negative for environmental allergies and food allergies.  Hematological: Negative for adenopathy.     Physical Exam:  Triage Vital Signs ED Triage Vitals  Enc Vitals Group     BP 11/23/18 0848 (!) 112/91     Pulse Rate 11/23/18 0848 87     Resp 11/23/18 0848 18     Temp 11/23/18 0848 98.1 F (36.7 C)     Temp Source 11/23/18 0848 Oral     SpO2 11/23/18 0848 100 %     Weight 11/23/18 0849 199 lb (90.3 kg)     Height 11/23/18 0849 5\' 9"  (1.753 m)     Head Circumference --      Peak Flow --      Pain Score 11/23/18 0849 0     Pain Loc --      Pain Edu? --      Excl. in [place]? --  Physical Exam  Constitutional: He is oriented to person, place, and time and well-developed, well-nourished, and in no distress.  HENT:  Head: Normocephalic and atraumatic.  Mouth/Throat: Oropharynx is clear and moist and mucous membranes are normal.  Eyes: Pupils are equal, round, and reactive to light. EOM are normal.  Neck: Normal range of motion. Neck supple. No tracheal deviation present.  Cardiovascular: Normal rate, regular rhythm, normal heart sounds and intact distal pulses. Exam reveals no gallop and no friction rub.  No murmur heard. Pulmonary/Chest: Effort normal and breath sounds normal. No respiratory distress. He has no wheezes. He has no rales.  Lymphadenopathy:    He has no cervical adenopathy.  Neurological: He is alert and oriented to person, place, and time.  Skin: Skin is warm and dry. Rash noted. Rash is urticarial.  Diffusely distributed urticarial rash to BILATERAL hands, upper extremities, feet, and anterior torso.  Psychiatric: Mood, affect and judgment normal.  Nursing note and vitals reviewed.   Urgent Care Treatments / Results:   LABS: PLEASE NOTE: all labs that were ordered this encounter are listed, however only abnormal results are displayed. Labs Reviewed - No data to display  EKG: -None  RADIOLOGY: No results found.  PROCEDURES:  Procedures  MEDICATIONS RECEIVED THIS VISIT: Medications - No data to display  PERTINENT CLINICAL COURSE NOTES/UPDATES:   Initial Impression / Assessment and Plan / Urgent Care Course:    Jonathan Ortega is a 57 y.o. male who presents to [facility] today with complaints of Urticaria  Pertinent labs & imaging results that were available during my care of the patient were personally reviewed by me and considered in my medical decision making (see lab/imaging section of note for values and interpretations).  Patient overall well appearing and in no acute distress today in clinic. Exam reveals diffusely distributed urticarial rash.  Etiology unknown as patient denies new medications, soaps, cosmetics, foods, and environmental exposures.  Diphenhydramine has been somewhat effective.  Will treat patient with a course of systemic steroids to help improve his rash.  He was encouraged to continue diphenhydramine dosing per package instructions.  Patient was cautioned on the use of the diphenhydramine, and was reminded that it will cause somnolence.  Patient leaving to go out of town tomorrow.  He was encouraged not to drive while taking the recommended diphenhydramine.  Discussed follow up with primary care physician in 1 week for re-evaluation. I have reviewed the follow up and strict return precautions for any new or worsening symptoms. Patient is aware of symptoms that would be deemed urgent/emergent, and would thus require further evaluation either here or in the emergency department. At the time of discharge, he verbalized understanding and consent with the discharge plan as it was reviewed with him. All questions were fielded by provider and/or clinic staff prior to patient discharge.    Final Clinical Impressions(s) / Urgent Care Diagnoses:   Final diagnoses:  Hives of unknown origin    New Prescriptions:   Meds ordered this encounter  Medications  . predniSONE (STERAPRED UNI-PAK 21  TAB) 10 MG (21) TBPK tablet    Sig: Take by mouth daily. Take by mouth: 60 mg x 1 day, 50 mg x 1 day, 40 mg x 1 day, 30 mg x 1 day, 20 mg x 1 day, 10 mg x 1 day    Dispense:  21 tablet    Refill:  0    Controlled Substance Prescriptions:   Controlled Substance Registry consulted? Not  Applicable  NOTE: This note was prepared using Dragon dictation software along with smaller phrase technology. Despite my best ability to proofread, there is the potential that transcriptional errors may still occur from this process, and are completely unintentional.     Karen Kitchens, NP 11/23/18 1954

## 2018-11-23 NOTE — ED Triage Notes (Signed)
Patient c/o hives that started yesterday. He states he took Benadryl yesterday with minimal relief.

## 2018-12-25 ENCOUNTER — Encounter
Admission: RE | Admit: 2018-12-25 | Discharge: 2018-12-25 | Disposition: A | Discharge status: Home / Self Care | Payer: BC Managed Care – PPO | Source: Ambulatory Visit | Attending: General Surgery | Admitting: General Surgery

## 2018-12-25 ENCOUNTER — Other Ambulatory Visit: Payer: Self-pay

## 2018-12-25 DIAGNOSIS — Z1159 Encounter for screening for other viral diseases: Secondary | ICD-10-CM | POA: Insufficient documentation

## 2018-12-25 LAB — SARS CORONAVIRUS 2 (TAT 6-24 HRS): SARS Coronavirus 2: NEGATIVE

## 2018-12-26 ENCOUNTER — Encounter (HOSPITAL_BASED_OUTPATIENT_CLINIC_OR_DEPARTMENT_OTHER): Payer: Self-pay | Admitting: *Deleted

## 2018-12-26 ENCOUNTER — Other Ambulatory Visit: Payer: Self-pay

## 2018-12-26 NOTE — Progress Notes (Signed)
Spoke w/ pt via phone for pre-op interview.  Npo after mn w/ exception clear liquids until 0800 then nothing by mouth,  pt verbalized understanding.  Needs istat and ekg.  Pt had covid test done 12-25-2018.  Will take norvasc am dos w/ sips of water.

## 2018-12-28 ENCOUNTER — Encounter (HOSPITAL_BASED_OUTPATIENT_CLINIC_OR_DEPARTMENT_OTHER)
Admission: RE | Disposition: A | Discharge status: Home / Self Care | Payer: Self-pay | Source: Home / Self Care | Attending: General Surgery

## 2018-12-28 ENCOUNTER — Ambulatory Visit (HOSPITAL_BASED_OUTPATIENT_CLINIC_OR_DEPARTMENT_OTHER): Payer: BC Managed Care – PPO | Admitting: Anesthesiology

## 2018-12-28 ENCOUNTER — Ambulatory Visit (HOSPITAL_BASED_OUTPATIENT_CLINIC_OR_DEPARTMENT_OTHER)
Admission: RE | Admit: 2018-12-28 | Discharge: 2018-12-28 | Disposition: A | Discharge status: Home / Self Care | Payer: BC Managed Care – PPO | Attending: General Surgery | Admitting: General Surgery

## 2018-12-28 ENCOUNTER — Encounter (HOSPITAL_BASED_OUTPATIENT_CLINIC_OR_DEPARTMENT_OTHER): Payer: Self-pay

## 2018-12-28 DIAGNOSIS — N400 Enlarged prostate without lower urinary tract symptoms: Secondary | ICD-10-CM | POA: Insufficient documentation

## 2018-12-28 DIAGNOSIS — K6289 Other specified diseases of anus and rectum: Secondary | ICD-10-CM | POA: Diagnosis not present

## 2018-12-28 DIAGNOSIS — Z7982 Long term (current) use of aspirin: Secondary | ICD-10-CM | POA: Diagnosis not present

## 2018-12-28 DIAGNOSIS — Z79899 Other long term (current) drug therapy: Secondary | ICD-10-CM | POA: Insufficient documentation

## 2018-12-28 DIAGNOSIS — K219 Gastro-esophageal reflux disease without esophagitis: Secondary | ICD-10-CM | POA: Insufficient documentation

## 2018-12-28 DIAGNOSIS — I10 Essential (primary) hypertension: Secondary | ICD-10-CM | POA: Diagnosis not present

## 2018-12-28 HISTORY — DX: Vitamin D deficiency, unspecified: E55.9

## 2018-12-28 HISTORY — DX: Personal history of other diseases of the digestive system: Z87.19

## 2018-12-28 HISTORY — DX: Unspecified hemorrhoids: K64.9

## 2018-12-28 HISTORY — DX: Mixed hyperlipidemia: E78.2

## 2018-12-28 HISTORY — DX: Other specified diseases of anus and rectum: K62.89

## 2018-12-28 HISTORY — DX: Presence of spectacles and contact lenses: Z97.3

## 2018-12-28 HISTORY — PX: EXCISION OF SKIN TAG: SHX6270

## 2018-12-28 HISTORY — DX: Benign prostatic hyperplasia with lower urinary tract symptoms: N40.1

## 2018-12-28 HISTORY — DX: Unspecified osteoarthritis, unspecified site: M19.90

## 2018-12-28 LAB — POCT I-STAT 4, (NA,K, GLUC, HGB,HCT)
Glucose, Bld: 104 mg/dL — ABNORMAL HIGH (ref 70–99)
HCT: 45 % (ref 39.0–52.0)
Hemoglobin: 15.3 g/dL (ref 13.0–17.0)
Potassium: 4 mmol/L (ref 3.5–5.1)
Sodium: 141 mmol/L (ref 135–145)

## 2018-12-28 SURGERY — EXCISION, SKIN TAG
Anesthesia: Monitor Anesthesia Care | Site: Anus

## 2018-12-28 MED ORDER — DEXAMETHASONE SODIUM PHOSPHATE 10 MG/ML IJ SOLN
INTRAMUSCULAR | Status: AC
  Filled 2018-12-28: qty 1

## 2018-12-28 MED ORDER — FENTANYL CITRATE (PF) 100 MCG/2ML IJ SOLN
25.0000 ug | INTRAMUSCULAR | Status: DC | PRN
  Filled 2018-12-28: qty 1

## 2018-12-28 MED ORDER — OXYCODONE HCL 5 MG PO TABS
5.0000 mg | ORAL_TABLET | Freq: Once | ORAL | Status: DC | PRN
  Filled 2018-12-28: qty 1

## 2018-12-28 MED ORDER — ACETAMINOPHEN 325 MG PO TABS
650.0000 mg | ORAL_TABLET | ORAL | Status: DC | PRN
  Filled 2018-12-28: qty 2

## 2018-12-28 MED ORDER — SODIUM CHLORIDE 0.9% FLUSH
3.0000 mL | Freq: Two times a day (BID) | INTRAVENOUS | Status: DC
  Filled 2018-12-28: qty 3

## 2018-12-28 MED ORDER — SODIUM CHLORIDE 0.9 % IV SOLN
250.0000 mL | INTRAVENOUS | Status: DC | PRN
  Filled 2018-12-28: qty 250

## 2018-12-28 MED ORDER — ONDANSETRON HCL 4 MG/2ML IJ SOLN
INTRAMUSCULAR | Status: AC
  Filled 2018-12-28: qty 2

## 2018-12-28 MED ORDER — SODIUM CHLORIDE 0.9% FLUSH
3.0000 mL | INTRAVENOUS | Status: DC | PRN
  Filled 2018-12-28: qty 3

## 2018-12-28 MED ORDER — OXYCODONE HCL 5 MG/5ML PO SOLN
5.0000 mg | Freq: Once | ORAL | Status: DC | PRN
  Filled 2018-12-28: qty 5

## 2018-12-28 MED ORDER — MIDAZOLAM HCL 2 MG/2ML IJ SOLN
INTRAMUSCULAR | Status: AC
  Filled 2018-12-28: qty 2

## 2018-12-28 MED ORDER — DEXAMETHASONE SODIUM PHOSPHATE 4 MG/ML IJ SOLN
INTRAMUSCULAR | Status: DC | PRN
  Administered 2018-12-28: 4 mg via INTRAVENOUS

## 2018-12-28 MED ORDER — BUPIVACAINE-EPINEPHRINE 0.5% -1:200000 IJ SOLN
INTRAMUSCULAR | Status: DC | PRN
  Administered 2018-12-28: 30 mL

## 2018-12-28 MED ORDER — ONDANSETRON HCL 4 MG/2ML IJ SOLN
INTRAMUSCULAR | Status: DC | PRN
  Administered 2018-12-28: 4 mg via INTRAVENOUS

## 2018-12-28 MED ORDER — LACTATED RINGERS IV SOLN
INTRAVENOUS | Status: DC
  Administered 2018-12-28: 13:00:00 via INTRAVENOUS
  Filled 2018-12-28: qty 1000

## 2018-12-28 MED ORDER — ACETAMINOPHEN 500 MG PO TABS
1000.0000 mg | ORAL_TABLET | ORAL | Status: AC
  Administered 2018-12-28: 1000 mg via ORAL
  Filled 2018-12-28: qty 2

## 2018-12-28 MED ORDER — ACETAMINOPHEN 500 MG PO TABS
ORAL_TABLET | ORAL | Status: AC
  Filled 2018-12-28: qty 2

## 2018-12-28 MED ORDER — LIDOCAINE HCL (CARDIAC) PF 100 MG/5ML IV SOSY
PREFILLED_SYRINGE | INTRAVENOUS | Status: DC | PRN
  Administered 2018-12-28: 60 mg via INTRAVENOUS

## 2018-12-28 MED ORDER — ACETAMINOPHEN 650 MG RE SUPP
650.0000 mg | RECTAL | Status: DC | PRN
  Filled 2018-12-28: qty 1

## 2018-12-28 MED ORDER — PROPOFOL 500 MG/50ML IV EMUL
INTRAVENOUS | Status: DC | PRN
  Administered 2018-12-28: 200 ug/kg/min via INTRAVENOUS

## 2018-12-28 MED ORDER — LIDOCAINE 2% (20 MG/ML) 5 ML SYRINGE
INTRAMUSCULAR | Status: AC
  Filled 2018-12-28: qty 5

## 2018-12-28 MED ORDER — KETAMINE HCL 10 MG/ML IJ SOLN
INTRAMUSCULAR | Status: AC
  Filled 2018-12-28: qty 1

## 2018-12-28 MED ORDER — MEPERIDINE HCL 25 MG/ML IJ SOLN
6.2500 mg | INTRAMUSCULAR | Status: DC | PRN
  Filled 2018-12-28: qty 1

## 2018-12-28 MED ORDER — KETAMINE HCL 10 MG/ML IJ SOLN
INTRAMUSCULAR | Status: DC | PRN
  Administered 2018-12-28: 50 mg via INTRAVENOUS

## 2018-12-28 MED ORDER — PROPOFOL 500 MG/50ML IV EMUL
INTRAVENOUS | Status: AC
  Filled 2018-12-28: qty 50

## 2018-12-28 MED ORDER — MIDAZOLAM HCL 5 MG/5ML IJ SOLN
INTRAMUSCULAR | Status: DC | PRN
  Administered 2018-12-28: 2 mg via INTRAVENOUS

## 2018-12-28 MED ORDER — PROMETHAZINE HCL 25 MG/ML IJ SOLN
6.2500 mg | INTRAMUSCULAR | Status: DC | PRN
  Filled 2018-12-28: qty 1

## 2018-12-28 SURGICAL SUPPLY — 49 items
BENZOIN TINCTURE PRP APPL 2/3 (GAUZE/BANDAGES/DRESSINGS) ×4 IMPLANT
BLADE EXTENDED COATED 6.5IN (ELECTRODE) IMPLANT
BLADE HEX COATED 2.75 (ELECTRODE) ×2 IMPLANT
BLADE SURG 10 STRL SS (BLADE) IMPLANT
BRIEF STRETCH FOR OB PAD LRG (UNDERPADS AND DIAPERS) ×2 IMPLANT
CANISTER SUCT 3000ML PPV (MISCELLANEOUS) ×2 IMPLANT
COVER BACK TABLE 60X90IN (DRAPES) ×2 IMPLANT
COVER MAYO STAND STRL (DRAPES) ×2 IMPLANT
COVER WAND RF STERILE (DRAPES) ×2 IMPLANT
DECANTER SPIKE VIAL GLASS SM (MISCELLANEOUS) ×2 IMPLANT
DRAPE LAPAROTOMY 100X72 PEDS (DRAPES) ×2 IMPLANT
DRAPE UTILITY XL STRL (DRAPES) ×2 IMPLANT
ELECT REM PT RETURN 9FT ADLT (ELECTROSURGICAL) ×2
ELECTRODE REM PT RTRN 9FT ADLT (ELECTROSURGICAL) ×1 IMPLANT
GAUZE SPONGE 4X4 12PLY STRL (GAUZE/BANDAGES/DRESSINGS) ×2 IMPLANT
GAUZE SPONGE 4X4 12PLY STRL LF (GAUZE/BANDAGES/DRESSINGS) ×1 IMPLANT
GLOVE BIO SURGEON STRL SZ 6.5 (GLOVE) ×2 IMPLANT
GLOVE BIOGEL PI IND STRL 7.0 (GLOVE) ×1 IMPLANT
GLOVE BIOGEL PI INDICATOR 7.0 (GLOVE) ×1
GOWN STRL REUS W/TWL XL LVL3 (GOWN DISPOSABLE) ×2 IMPLANT
HYDROGEN PEROXIDE 16OZ (MISCELLANEOUS) ×2 IMPLANT
IV CATH 14GX2 1/4 (CATHETERS) ×2 IMPLANT
IV CATH 18G SAFETY (IV SOLUTION) ×2 IMPLANT
KIT SIGMOIDOSCOPE (SET/KITS/TRAYS/PACK) IMPLANT
KIT TURNOVER CYSTO (KITS) ×2 IMPLANT
LOOP VESSEL MAXI BLUE (MISCELLANEOUS) IMPLANT
NEEDLE HYPO 22GX1.5 SAFETY (NEEDLE) ×2 IMPLANT
NS IRRIG 500ML POUR BTL (IV SOLUTION) ×2 IMPLANT
PACK BASIN DAY SURGERY FS (CUSTOM PROCEDURE TRAY) ×2 IMPLANT
PAD ABD 8X10 STRL (GAUZE/BANDAGES/DRESSINGS) ×2 IMPLANT
PAD ARMBOARD 7.5X6 YLW CONV (MISCELLANEOUS) IMPLANT
PENCIL BUTTON HOLSTER BLD 10FT (ELECTRODE) ×2 IMPLANT
SPONGE HEMORRHOID 8X3CM (HEMOSTASIS) IMPLANT
SPONGE SURGIFOAM ABS GEL 12-7 (HEMOSTASIS) IMPLANT
SUCTION FRAZIER HANDLE 10FR (MISCELLANEOUS)
SUCTION TUBE FRAZIER 10FR DISP (MISCELLANEOUS) IMPLANT
SUT CHROMIC 2 0 SH (SUTURE) ×1 IMPLANT
SUT CHROMIC 3 0 SH 27 (SUTURE) IMPLANT
SUT ETHIBOND 0 (SUTURE) IMPLANT
SUT VIC AB 2-0 SH 27 (SUTURE)
SUT VIC AB 2-0 SH 27XBRD (SUTURE) IMPLANT
SUT VIC AB 3-0 SH 18 (SUTURE) IMPLANT
SUT VIC AB 3-0 SH 27 (SUTURE)
SUT VIC AB 3-0 SH 27XBRD (SUTURE) IMPLANT
SYR CONTROL 10ML LL (SYRINGE) ×2 IMPLANT
TOWEL OR 17X26 10 PK STRL BLUE (TOWEL DISPOSABLE) ×2 IMPLANT
TRAY DSU PREP LF (CUSTOM PROCEDURE TRAY) ×2 IMPLANT
TUBE CONNECTING 12X1/4 (SUCTIONS) ×2 IMPLANT
YANKAUER SUCT BULB TIP NO VENT (SUCTIONS) ×2 IMPLANT

## 2018-12-28 NOTE — Transfer of Care (Signed)
Last Vitals:  Vitals Value Taken Time  BP 134/71 12/28/18 1423  Temp 36.7 C 12/28/18 1423  Pulse 75 12/28/18 1425  Resp 15 12/28/18 1425  SpO2 100 % 12/28/18 1425  Vitals shown include unvalidated device data.  Last Pain:  Vitals:   12/28/18 1242  TempSrc: Oral  PainSc: 0-No pain      Patients Stated Pain Goal: 7 (12/28/18 1242)  Immediate Anesthesia Transfer of Care Note  Patient: Jonathan Ortega  Procedure(s) Performed: Procedure(s) (LRB): EXCISION OF ANAL PAPPILLA (N/A)  Patient Location: PACU  Anesthesia Type: MAC  Level of Consciousness: awake, alert  and oriented  Airway & Oxygen Therapy: Patient Spontanous Breathing and Patient connected to nasal cannula oxygen  Post-op Assessment: Report given to PACU RN and Post -op Vital signs reviewed and stable  Post vital signs: Reviewed and stable  Complications: No apparent anesthesia complications

## 2018-12-28 NOTE — Anesthesia Postprocedure Evaluation (Signed)
Anesthesia Post Note  Patient: Jonathan Ortega  Procedure(s) Performed: EXCISION OF ANAL PAPPILLA (N/A Anus)     Patient location during evaluation: PACU Anesthesia Type: MAC Level of consciousness: awake and alert Pain management: pain level controlled Vital Signs Assessment: post-procedure vital signs reviewed and stable Respiratory status: spontaneous breathing Cardiovascular status: stable Anesthetic complications: no    Last Vitals:  Vitals:   12/28/18 1242 12/28/18 1423  BP: 128/90 134/71  Pulse: 72 77  Resp: 12 19  Temp: 36.9 C 36.7 C  SpO2: 100% 100%    Last Pain:  Vitals:   12/28/18 1423  TempSrc:   PainSc: 0-No pain                 Nolon Nations

## 2018-12-28 NOTE — Anesthesia Procedure Notes (Signed)
Procedure Name: MAC Date/Time: 12/28/2018 1:57 PM Performed by: Nolon Nations, MD Pre-anesthesia Checklist: Patient identified, Emergency Drugs available, Suction available, Patient being monitored and Timeout performed Oxygen Delivery Method: Nasal cannula Placement Confirmation: positive ETCO2,  CO2 detector and breath sounds checked- equal and bilateral

## 2018-12-28 NOTE — H&P (Signed)
The patient is a 58 year old male who presents with anal lesions. 58 year old male who presents to the office for evaluation of an anal polyp. This was seen on colonoscopy in August 2019. Patient states he has not noticed this before. He does state that he has a history of something resected from his anal canal approximately 25 years ago by a Psychologist, sport and exercise in [place].   Past Surgical History [name], [place]; 10/18/2018 1:28 PM) Anal Fissure Repair   Diagnostic Studies History [name], CMA; 10/18/2018 1:28 PM) Colonoscopy  within last year  Allergies [name], CMA; 10/18/2018 1:28 PM) No Known Drug Allergies [10/18/2018]: Allergies Reconciled   Medication History [name], CMA; 10/18/2018 1:29 PM) amLODIPine Besylate (5MG  Tablet, Oral) Active. Omeprazole (20MG  Capsule DR, Oral) Active. Silodosin (8MG  Capsule, Oral) Active. Aspirin (81MG  Capsule, Oral) Active. Loratadine (10MG  Tablet, Oral) Active. Medications Reconciled  Social History [name], CMA; 10/18/2018 1:28 PM) Alcohol use  Occasional alcohol use. Caffeine use  Carbonated beverages, Coffee. No drug use  Tobacco use  Never smoker.  Family History [name], [place]; 10/18/2018 1:28 PM) Ovarian Cancer  Mother.  Other Problems [name], CMA; 10/18/2018 1:28 PM) Enlarged Prostate  Hemorrhoids  High blood pressure     Review of Systems [name] CMA; 10/18/2018 1:28 PM) General Not Present- Appetite Loss, Chills, Fatigue, Fever, Night Sweats, Weight Gain and Weight Loss. Skin Not Present- Change in Wart/Mole, Dryness, Hives, Jaundice, New Lesions, Non-Healing Wounds, Rash and Ulcer. HEENT Present- Seasonal Allergies, Visual Disturbances and Wears glasses/contact lenses. Not Present- Earache, Hearing Loss, Hoarseness, Nose Bleed, Oral Ulcers, Ringing in the Ears, Sinus Pain, Sore Throat and Yellow Eyes. Respiratory Present- Snoring. Not Present- Bloody sputum,  Chronic Cough, Difficulty Breathing and Wheezing. Cardiovascular Not Present- Chest Pain, Difficulty Breathing Lying Down, Leg Cramps, Palpitations, Rapid Heart Rate, Shortness of Breath and Swelling of Extremities. Gastrointestinal Present- Hemorrhoids and Indigestion. Not Present- Abdominal Pain, Bloating, Bloody Stool, Change in Bowel Habits, Chronic diarrhea, Constipation, Difficulty Swallowing, Excessive gas, Gets full quickly at meals, Nausea, Rectal Pain and Vomiting. Male Genitourinary Present- Nocturia. Not Present- Blood in Urine, Change in Urinary Stream, Frequency, Impotence, Painful Urination, Urgency and Urine Leakage. Musculoskeletal Not Present- Back Pain, Joint Pain, Joint Stiffness, Muscle Pain, Muscle Weakness and Swelling of Extremities. Neurological Not Present- Decreased Memory, Fainting, Headaches, Numbness, Seizures, Tingling, Tremor, Trouble walking and Weakness. Psychiatric Not Present- Anxiety, Bipolar, Change in Sleep Pattern, Depression, Fearful and Frequent crying. Endocrine Not Present- Cold Intolerance, Excessive Hunger, Hair Changes, Heat Intolerance, Hot flashes and New Diabetes. Hematology Not Present- Blood Thinners, Easy Bruising, Excessive bleeding, Gland problems, HIV and Persistent Infections.  BP 128/90   Pulse 72   Temp 98.4 F (36.9 C) (Oral)   Resp 12   Ht 5\' 9"  (1.753 m)   Wt 87.6 kg   SpO2 100%   BMI 28.53 kg/m     Physical Exam  General Mental Status-Alert. General Appearance-Not in acute distress. Build & Nutrition-Well nourished. Posture-Normal posture. Gait-Normal.  Head and Neck Head-normocephalic, atraumatic with no lesions or palpable masses. Trachea-midline.  Chest and Lung Exam Chest and lung exam reveals -on auscultation, normal breath sounds, no adventitious sounds and normal vocal resonance.  Cardiovascular Cardiovascular examination reveals -normal heart sounds, regular rate and rhythm with no  murmurs and no digital clubbing, cyanosis, edema, increased warmth or tenderness.  Abdomen Inspection Inspection of the abdomen reveals - No Hernias. Palpation/Percussion Palpation and Percussion of the abdomen reveal - Soft, Non Tender, No Rigidity (  guarding), No hepatosplenomegaly and No Palpable abdominal masses.  Rectal Anorectal Exam External - normal external exam. Internal - normal internal exam.  Neurologic Neurologic evaluation reveals -alert and oriented x 3 with no impairment of recent or remote memory, normal attention span and ability to concentrate, normal sensation and normal coordination.  Musculoskeletal Normal Exam - Bilateral-Upper Extremity Strength Normal and Lower Extremity Strength Normal.   Results Leighton Ruff MD; 01/28/538 1:40 PM) Procedures  Name Value Date ANOSCOPY, DIAGNOSTIC (76734) [ Hemorrhoids ] Procedure Other: Procedure: Anoscopy....Marland KitchenMarland KitchenSurgeon: [name]....Marland KitchenMarland KitchenAfter the risks and benefits were explained, verbal consent was obtained for above procedure. A medical assistant chaperone was present thoroughout the entire procedure. 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 It is rather enlarged and I have recommended excision. I believe this would be best to be done under anesthesia. We have discussed this in detail. All questions were answered. Risks include bleeding, pain and recurrence.

## 2018-12-28 NOTE — Anesthesia Preprocedure Evaluation (Signed)
Anesthesia Evaluation  Patient identified by MRN, date of birth, ID band Patient awake    Reviewed: Allergy & Precautions, NPO status , Patient's Chart, lab work & pertinent test results  Airway Mallampati: II  TM Distance: >3 FB     Dental  (+) Teeth Intact, Dental Advisory Given   Pulmonary neg pulmonary ROS,    Pulmonary exam normal        Cardiovascular hypertension, Pt. on medications Normal cardiovascular exam Rhythm:Regular     Neuro/Psych negative neurological ROS  negative psych ROS   GI/Hepatic Neg liver ROS, GERD  Medicated,  Endo/Other  negative endocrine ROS  Renal/GU negative Renal ROS     Musculoskeletal  (+) Arthritis , Osteoarthritis,    Abdominal Normal abdominal exam  (+)   Peds  Hematology   Anesthesia Other Findings   Reproductive/Obstetrics                             Anesthesia Physical  Anesthesia Plan  ASA: II  Anesthesia Plan: MAC   Post-op Pain Management:    Induction: Intravenous  PONV Risk Score and Plan: TIVA  Airway Management Planned: Natural Airway  Additional Equipment:   Intra-op Plan:   Post-operative Plan:   Informed Consent: I have reviewed the patients History and Physical, chart, labs and discussed the procedure including the risks, benefits and alternatives for the proposed anesthesia with the patient or authorized representative who has indicated his/her understanding and acceptance.     Dental advisory given  Plan Discussed with: CRNA and Surgeon  Anesthesia Plan Comments:         Anesthesia Quick Evaluation

## 2018-12-28 NOTE — Op Note (Signed)
12/28/2018  2:16 PM  PATIENT:  Jonathan Ortega  57 y.o. male  Patient Care Team: [name], DO as PCP - General (Family Medicine)  PRE-OPERATIVE DIAGNOSIS:  ANAL PAPILLA  POST-OPERATIVE DIAGNOSIS:  ANAL PAPILLA  PROCEDURE:   EXCISION OF ANAL [place], anterior midline   Surgeon(s): Leighton Ruff, MD  ASSISTANT: none   ANESTHESIA:   local and MAC  SPECIMEN:  Source of Specimen:  anal papilla  DISPOSITION OF SPECIMEN:  PATHOLOGY  COUNTS:  YES  PLAN OF CARE: Discharge to home after PACU  PATIENT DISPOSITION:  PACU - hemodynamically stable.  INDICATION: 58 y.o. M with anal papilla, here for excision   OR FINDINGS: anal papilla, ant midline  DESCRIPTION: the patient was identified in the preoperative holding area and taken to the OR where they were laid on the operating room table.  MAC anesthesia was induced without difficulty. The patient was then positioned in prone jackknife position with buttocks gently taped apart.  The patient was then prepped and draped in usual sterile fashion.  SCDs were noted to be in place prior to the initiation of anesthesia. A surgical timeout was performed indicating the correct patient, procedure, positioning and need for preoperative antibiotics.  A rectal block was performed using Marcaine with epinephrine.    I began with a digital rectal exam.  The mass was palpated at anterior midline.  I then placed a Hill-Ferguson anoscope into the anal canal and evaluated this completely.  There was minimal hemorrhoid disease.  The papilla was elevated and resected with scissors.  The edges were re-approximated with a 2-0 Chromic suture.  The patient tolerated with well.  He was awakened from anesthesia and sent to the PACU in table condition.  All counts were correct per OR staff.

## 2018-12-28 NOTE — Discharge Instructions (Addendum)
Beginning the day after surgery: ° °You may sit in a tub of warm water 2-3 times a day to relieve discomfort. ° °Eat a regular diet high in fiber.  Avoid foods that give you constipation or diarrhea.  Avoid foods that are difficult to digest, such as seeds, nuts, corn or popcorn. ° °Do not go any longer than 2 days without a bowel movement.  You may take a dose of Milk of Magnesia if you become constipated.   ° °Drink 6-8 glasses of water daily. ° °Walking is encouraged.  Avoid strenuous activity and heavy lifting for one month after surgery.   ° °Call the office if you have any questions or concerns.  Call immediately if you develop: ° °· Excessive rectal bleeding (more than a cup or passing large clots) °· Increased discomfort °· Fever greater than 100 F °· Difficulty urinating ° °Post Anesthesia Home Care Instructions ° °Activity: °Get plenty of rest for the remainder of the day. A responsible adult should stay with you for 24 hours following the procedure.  °For the next 24 hours, DO NOT: °-Drive a car °-Operate machinery °-Drink alcoholic beverages °-Take any medication unless instructed by your physician °-Make any legal decisions or sign important papers. ° °Meals: °Start with liquid foods such as gelatin or soup. Progress to regular foods as tolerated. Avoid greasy, spicy, heavy foods. If nausea and/or vomiting occur, drink only clear liquids until the nausea and/or vomiting subsides. Call your physician if vomiting continues. ° °Special Instructions/Symptoms: °Your throat may feel dry or sore from the anesthesia or the breathing tube placed in your throat during surgery. If this causes discomfort, gargle with warm salt water. The discomfort should disappear within 24 hours. ° °If you had a scopolamine patch placed behind your ear for the management of post- operative nausea and/or vomiting: ° °1. The medication in the patch is effective for 72 hours, after which it should be removed.  Wrap patch in a tissue  and discard in the trash. Wash hands thoroughly with soap and water. °2. You may remove the patch earlier than 72 hours if you experience unpleasant side effects which may include dry mouth, dizziness or visual disturbances. °3. Avoid touching the patch. Wash your hands with soap and water after contact with the patch. °  ° °

## 2018-12-31 ENCOUNTER — Encounter (HOSPITAL_BASED_OUTPATIENT_CLINIC_OR_DEPARTMENT_OTHER): Payer: Self-pay | Admitting: General Surgery

## 2019-03-19 ENCOUNTER — Other Ambulatory Visit: Payer: Self-pay

## 2019-03-19 ENCOUNTER — Ambulatory Visit (INDEPENDENT_AMBULATORY_CARE_PROVIDER_SITE_OTHER): Payer: BC Managed Care – PPO

## 2019-03-19 DIAGNOSIS — Z23 Encounter for immunization: Secondary | ICD-10-CM

## 2019-07-11 IMAGING — US US ABDOMEN LIMITED
1 series · 14 of 25 positions shown · non-contrast
Comparison: None.

CLINICAL DATA: Elevated liver enzymes

EXAM:
ULTRASOUND ABDOMEN LIMITED RIGHT UPPER QUADRANT

[Series 1: us abdomen limited · 0.26mm/px · 14 of 46 slices shown]
[im 1/46]
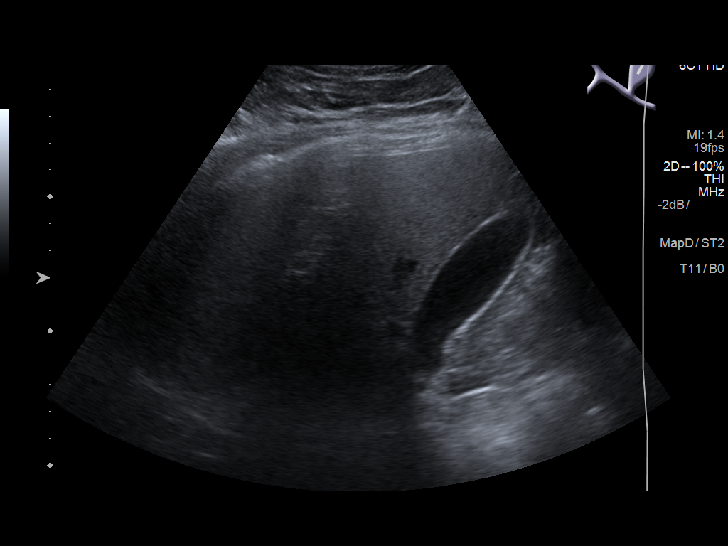
[im 4/46]
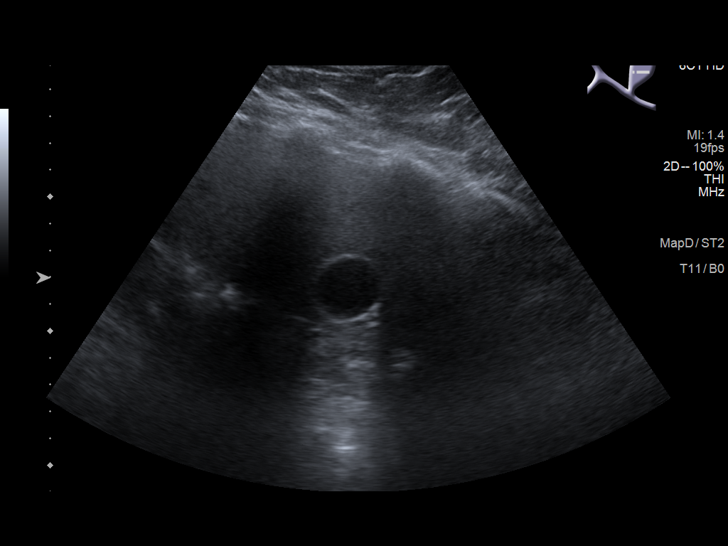
[im 8/46]
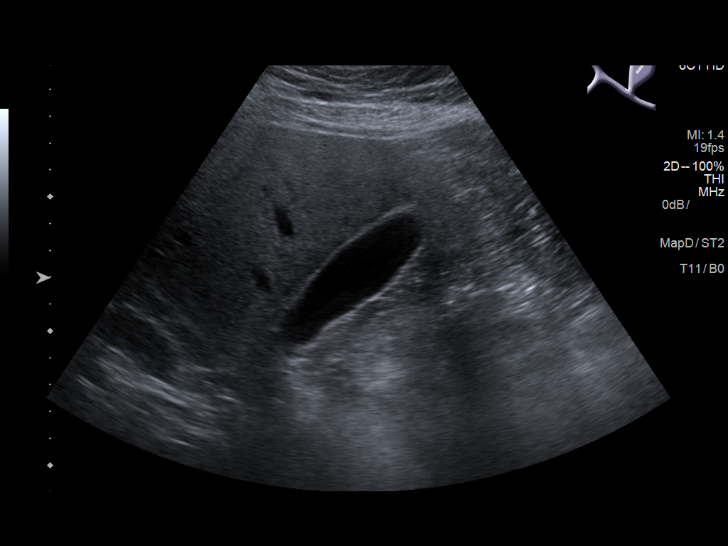
[im 12/46]
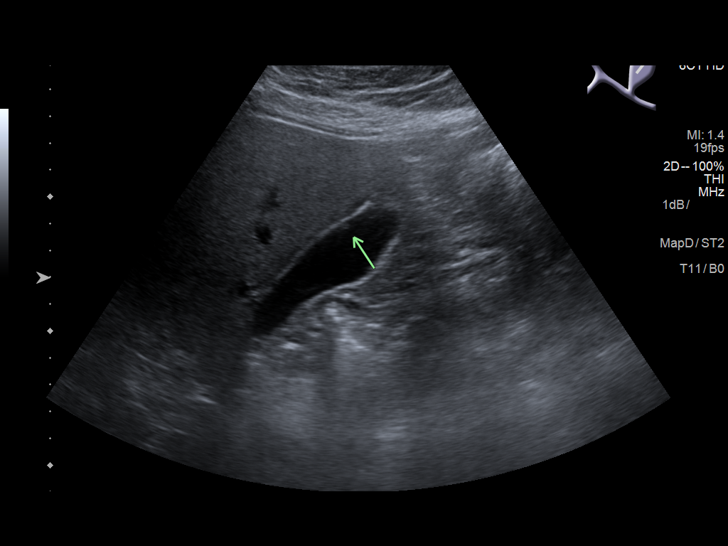
[im 16/46]
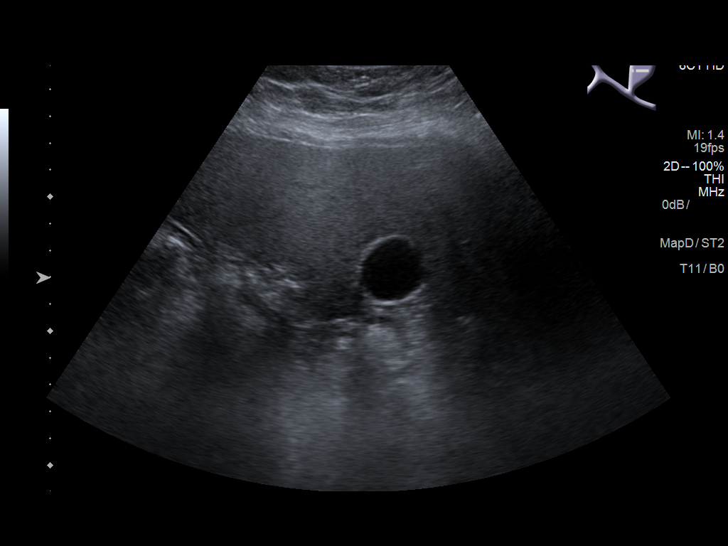
[im 17/46]
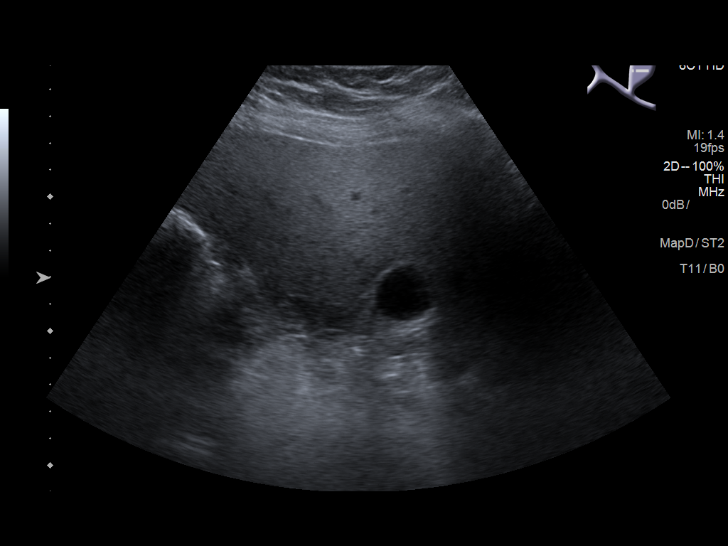
[im 21/46]
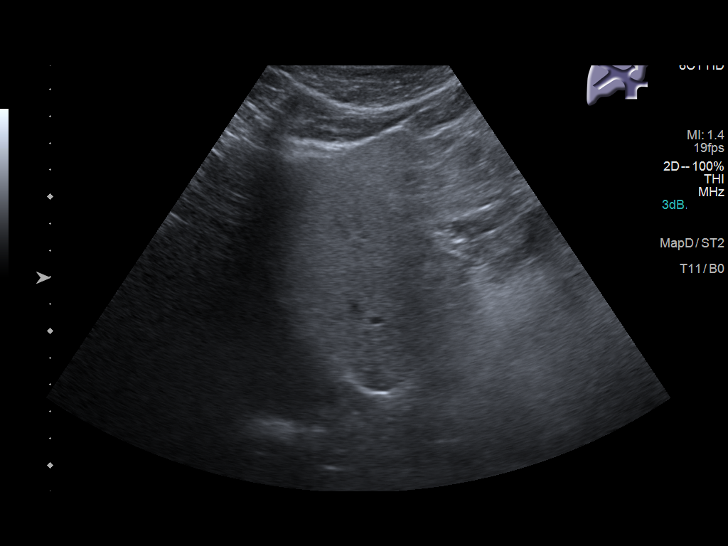
[im 25/46]
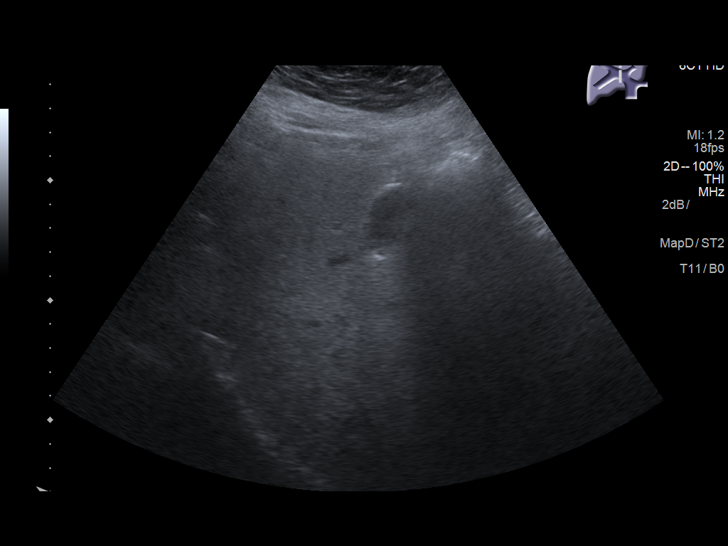
[im 29/46]
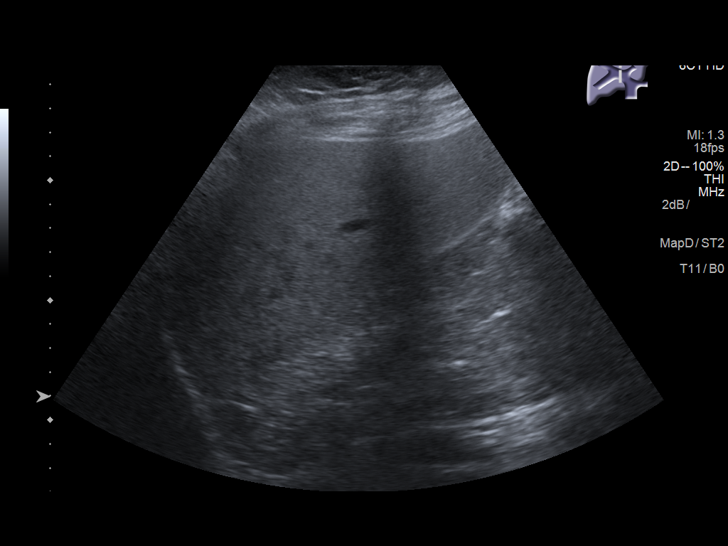
[im 31/46]
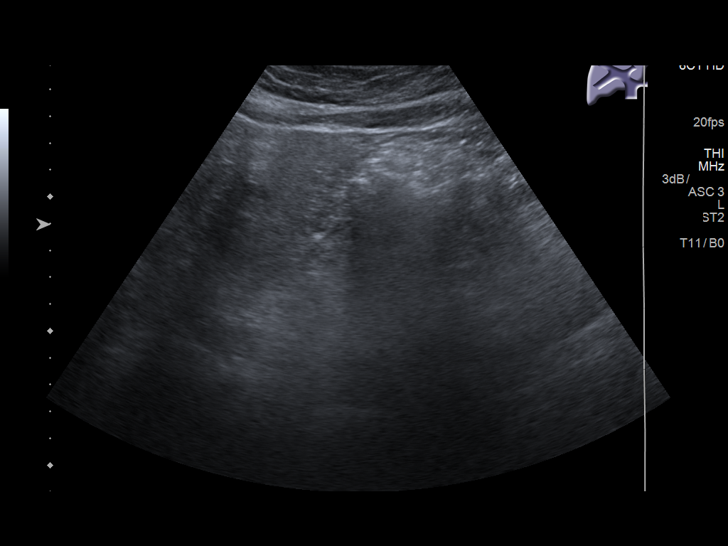
[im 34/46]
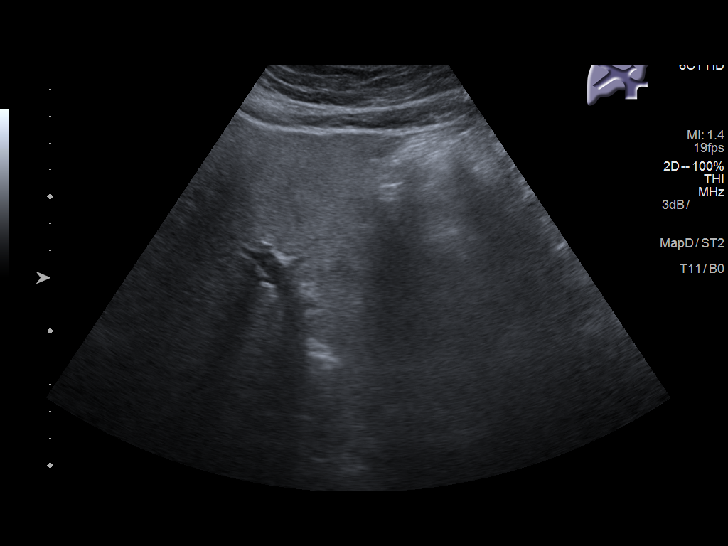
[im 38/46]
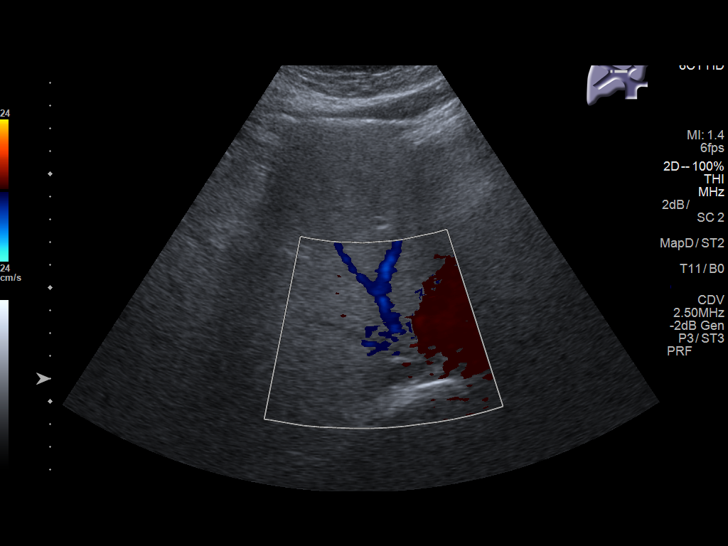
[im 42/46]
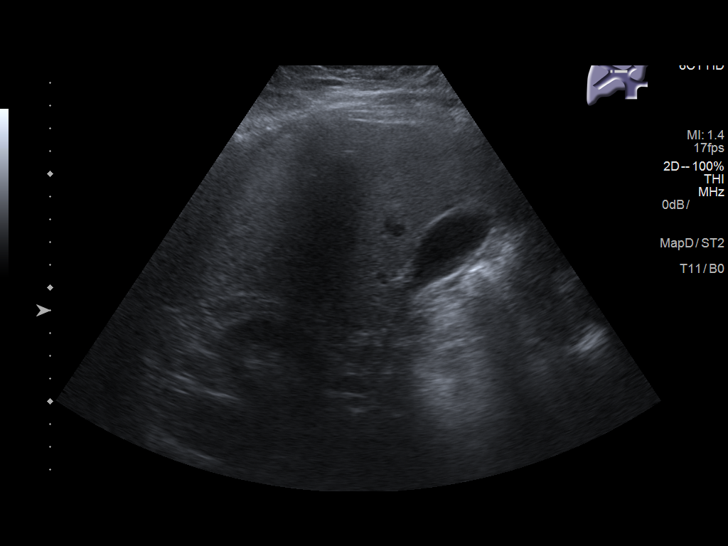
[im 46/46]
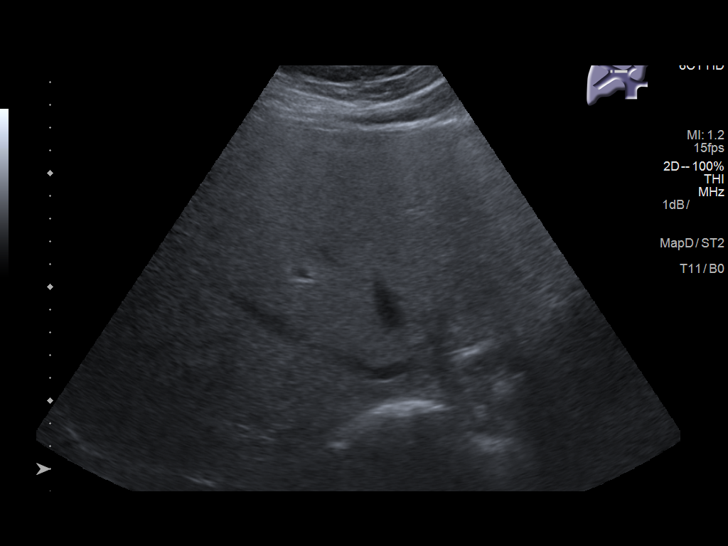

[14 of 25 positions shown; findings below may reference images not displayed]

FINDINGS: Gallbladder:

No gallstones or wall thickening visualized. There is no
pericholecystic fluid. No sonographic Murphy sign noted by
sonographer.

Common bile duct:

Diameter: 2 mm. No intrahepatic or extrahepatic biliary duct
dilatation.

Liver:

No focal lesion identified. Liver echogenicity overall is
increased.. Portal vein is patent on color Doppler imaging with
normal direction of blood flow towards the liver.
IMPRESSION: Overall increase in liver echogenicity, a finding indicative of
hepatic steatosis. While no focal liver lesions are evident on this
study, it must be cautioned that the sensitivity of ultrasound for
detection of focal liver lesions is diminished in this circumstance.

Study otherwise unremarkable.

## 2019-09-09 ENCOUNTER — Other Ambulatory Visit: Payer: Self-pay | Admitting: Family Medicine

## 2019-09-09 DIAGNOSIS — I10 Essential (primary) hypertension: Secondary | ICD-10-CM

## 2019-09-09 DIAGNOSIS — N401 Enlarged prostate with lower urinary tract symptoms: Secondary | ICD-10-CM

## 2019-09-18 ENCOUNTER — Other Ambulatory Visit: Payer: Self-pay | Admitting: Family Medicine

## 2019-09-18 DIAGNOSIS — K219 Gastro-esophageal reflux disease without esophagitis: Secondary | ICD-10-CM

## 2019-09-18 NOTE — Telephone Encounter (Signed)
Appointment 10/21/19- refilled per protocol

## 2019-10-11 ENCOUNTER — Ambulatory Visit: Payer: BC Managed Care – PPO | Attending: Internal Medicine

## 2019-10-11 DIAGNOSIS — Z20822 Contact with and (suspected) exposure to covid-19: Secondary | ICD-10-CM

## 2019-10-12 LAB — NOVEL CORONAVIRUS, NAA: SARS-CoV-2, NAA: DETECTED — AB

## 2019-10-12 LAB — SARS-COV-2, NAA 2 DAY TAT

## 2019-10-13 ENCOUNTER — Telehealth: Payer: Self-pay | Admitting: Adult Health

## 2019-10-13 NOTE — Telephone Encounter (Signed)
Called to discuss with Jonathan Ortega about Covid symptoms and the use of bamlanivimab, a monoclonal antibody infusion for those with mild to moderate Covid symptoms and at a high risk of hospitalization.     Pt is qualified for this infusion at the [facility] infusion center due to co-morbid conditions and/or a member of an at-risk group, however declines infusion at this time. Symptoms tier reviewed as well as criteria for ending isolation.  Symptoms reviewed that would warrant ED/Hospital evaluation. Preventative practices reviewed. Patient verbalized understanding. Patient advised to call back if he decides that he does want to get infusion. Callback number to the infusion center given. Patient advised to go to Urgent care or ED with severe symptoms. Last date he  would be eligible for infusion is 10/18/19    Patient Active Problem List   Diagnosis Date Noted  . Essential hypertension 07/09/2018  . Rectal tumor   . Diverticulosis of large intestine without diverticulitis   . Benign neoplasm of descending colon   . Benign neoplasm of ascending colon   . Columnar epithelial-lined lower esophagus   . Gastric polyp   . Stomach irritation   . Heartburn   . Hyperlipidemia 10/27/2016  . Special screening for malignant neoplasms, colon 10/27/2016  . Hemorrhoids 04/25/2016  . Pre-diabetes 10/19/2015  . Elevated liver enzymes 10/19/2015  . Lateral epicondylitis of right elbow 10/19/2015  . GERD (gastroesophageal reflux disease) 04/20/2015  . H/O seasonal allergies 04/20/2015  . Vitamin D deficiency 04/20/2015  . BPH (benign prostatic hyperplasia) 04/20/2015    Fransisco Messmer NP-C  [facility]   10/13/2019

## 2019-10-21 ENCOUNTER — Other Ambulatory Visit: Payer: BLUE CROSS/BLUE SHIELD

## 2019-10-25 ENCOUNTER — Encounter: Payer: BLUE CROSS/BLUE SHIELD | Admitting: Family Medicine

## 2019-11-08 ENCOUNTER — Other Ambulatory Visit: Payer: Self-pay

## 2019-11-08 ENCOUNTER — Other Ambulatory Visit: Payer: BC Managed Care – PPO

## 2019-11-08 ENCOUNTER — Other Ambulatory Visit: Payer: Self-pay | Admitting: Family Medicine

## 2019-11-08 DIAGNOSIS — N401 Enlarged prostate with lower urinary tract symptoms: Secondary | ICD-10-CM

## 2019-11-08 DIAGNOSIS — R351 Nocturia: Secondary | ICD-10-CM

## 2019-11-08 DIAGNOSIS — Z0000 Encounter for general adult medical examination without abnormal findings: Secondary | ICD-10-CM

## 2019-11-08 DIAGNOSIS — I10 Essential (primary) hypertension: Secondary | ICD-10-CM

## 2019-11-08 DIAGNOSIS — E782 Mixed hyperlipidemia: Secondary | ICD-10-CM

## 2019-11-08 DIAGNOSIS — R7303 Prediabetes: Secondary | ICD-10-CM

## 2019-11-09 LAB — CBC WITH DIFFERENTIAL/PLATELET
Absolute Monocytes: 720 cells/uL (ref 200–950)
Basophils Absolute: 43 cells/uL (ref 0–200)
Basophils Relative: 0.6 %
Eosinophils Absolute: 533 cells/uL — ABNORMAL HIGH (ref 15–500)
Eosinophils Relative: 7.4 %
HCT: 45 % (ref 38.5–50.0)
Hemoglobin: 15.3 g/dL (ref 13.2–17.1)
Lymphs Abs: 1606 cells/uL (ref 850–3900)
MCH: 32.6 pg (ref 27.0–33.0)
MCHC: 34 g/dL (ref 32.0–36.0)
MCV: 95.7 fL (ref 80.0–100.0)
MPV: 10.5 fL (ref 7.5–12.5)
Monocytes Relative: 10 %
Neutro Abs: 4298 cells/uL (ref 1500–7800)
Neutrophils Relative %: 59.7 %
Platelets: 142 10*3/uL (ref 140–400)
RBC: 4.7 10*6/uL (ref 4.20–5.80)
RDW: 12 % (ref 11.0–15.0)
Total Lymphocyte: 22.3 %
WBC: 7.2 10*3/uL (ref 3.8–10.8)

## 2019-11-09 LAB — LIPID PANEL
Cholesterol: 169 mg/dL (ref <200)
HDL: 43 mg/dL (ref >=40)
LDL Cholesterol (Calc): 104 mg/dL (calc) — ABNORMAL HIGH
Non-HDL Cholesterol (Calc): 126 mg/dL (calc) (ref <130)
Total CHOL/HDL Ratio: 3.9 (calc) (ref <5.0)
Triglycerides: 127 mg/dL (ref <150)

## 2019-11-09 LAB — HEMOGLOBIN A1C
Hgb A1c MFr Bld: 5.2 % of total Hgb (ref <5.7)
Mean Plasma Glucose: 103 (calc)
eAG (mmol/L): 5.7 (calc)

## 2019-11-09 LAB — COMPLETE METABOLIC PANEL WITH GFR
AG Ratio: 1.7 (calc) (ref 1.0–2.5)
ALT: 30 U/L (ref 9–46)
AST: 21 U/L (ref 10–35)
Albumin: 4.1 g/dL (ref 3.6–5.1)
Alkaline phosphatase (APISO): 85 U/L (ref 35–144)
BUN: 12 mg/dL (ref 7–25)
CO2: 27 mmol/L (ref 20–32)
Calcium: 8.8 mg/dL (ref 8.6–10.3)
Chloride: 105 mmol/L (ref 98–110)
Creat: 1.16 mg/dL (ref 0.70–1.33)
GFR, Est African American: 81 mL/min/{1.73_m2} (ref >=60)
GFR, Est Non African American: 70 mL/min/{1.73_m2} (ref >=60)
Globulin: 2.4 g/dL (calc) (ref 1.9–3.7)
Glucose, Bld: 108 mg/dL — ABNORMAL HIGH (ref 65–99)
Potassium: 4 mmol/L (ref 3.5–5.3)
Sodium: 141 mmol/L (ref 135–146)
Total Bilirubin: 0.6 mg/dL (ref 0.2–1.2)
Total Protein: 6.5 g/dL (ref 6.1–8.1)

## 2019-11-09 LAB — PSA: PSA: 0.5 ng/mL (ref <=4.0)

## 2019-11-14 ENCOUNTER — Other Ambulatory Visit: Payer: Self-pay

## 2019-11-14 ENCOUNTER — Other Ambulatory Visit: Payer: Self-pay | Admitting: Family Medicine

## 2019-11-14 ENCOUNTER — Ambulatory Visit (INDEPENDENT_AMBULATORY_CARE_PROVIDER_SITE_OTHER): Payer: BC Managed Care – PPO | Admitting: Family Medicine

## 2019-11-14 ENCOUNTER — Encounter: Payer: Self-pay | Admitting: Family Medicine

## 2019-11-14 VITALS — BP 127/81 | HR 73 | Temp 97.5°F | Resp 16 | Ht 69.0 in | Wt 200.0 lb

## 2019-11-14 DIAGNOSIS — E782 Mixed hyperlipidemia: Secondary | ICD-10-CM

## 2019-11-14 DIAGNOSIS — Z0000 Encounter for general adult medical examination without abnormal findings: Secondary | ICD-10-CM | POA: Diagnosis not present

## 2019-11-14 DIAGNOSIS — R351 Nocturia: Secondary | ICD-10-CM

## 2019-11-14 DIAGNOSIS — I10 Essential (primary) hypertension: Secondary | ICD-10-CM | POA: Diagnosis not present

## 2019-11-14 DIAGNOSIS — R7309 Other abnormal glucose: Secondary | ICD-10-CM

## 2019-11-14 DIAGNOSIS — N401 Enlarged prostate with lower urinary tract symptoms: Secondary | ICD-10-CM | POA: Diagnosis not present

## 2019-11-14 NOTE — Assessment & Plan Note (Signed)
Significantly improved lipid panel Not on statin Last lipid panel 11/2019 Calculated ASCVD 10 yr risk score 6.3%  Plan: 1. Discussion of ASCVD risk reduction statin vs ASA and review guidelines on ASA use 2. Ultimately agree to continue ASA 81mg  for primary ASCVD risk reduction now at age 59 with low bleeding risk, may continue into age 72s - possibly switch to Statin in future 3. Encourage improved lifestyle - low carb/cholesterol, reduce portion size, continue improving regular exercise 4. Follow-up yearly lipid

## 2019-11-14 NOTE — Assessment & Plan Note (Signed)
Stable chronic BPH with controlled LUTS, w/o obstruction - AUA BPH score has been stable now 4-6 score - Last PSA 0.5 (11/2019) - prior 0.5 to 0.7 - Last DRE 2017, reported normal - No known personal/family history of prostate CA  Plan: 1. Continue generic Silodosin 8mg  daily  Follow-up as needed, future consider alternative Urology referral

## 2019-11-14 NOTE — Progress Notes (Signed)
Subjective:    Patient ID: Jonathan Ortega, male    DOB: Nov 15, 1960, 59 y.o.   MRN: [id number]  Jonathan Ortega is a 59 y.o. male presenting on 11/14/2019 for Annual Exam   HPI   Here for Annual Physical and Lab Review.  FOLLOW-UPGERD: Last EGD 01/05/18 done by Jonathan Ortega. Showed gastrc polyps negative and some mild erythema. He was on H2 Zantac but then recalled, now he remains on PPI Omeprazole 20mg  daily doing well, needs refill Jonathan Ortega's sidewith maternal uncleof family with esophageal cancer  Pre-Diabetes Last A1c on lab 5.2 improved now - Never on medication Lifestyle: - Weight stable - Diet: Improving reduce sugar / lower carb healthy diet, rare soda, drinks unsweet tea, water, coffee - Exercise: Walking up to 4 miles daily - No significant family history of DM Denies hypoglycemia  CHRONIC HTN: Reports no new concerns. BP readings normal Current Meds - Amlodipine 5mg  daily   Reports good compliance, took meds today. Tolerating well, w/o complaints.  HYPERLIPIDEMIA: - Reports no concerns. Last lipids 11/2019,withimproved overall Not on statin - Taking ASA 81mg  daily for primary prevention. No known CAD / MI / CVA - Not taking any medicines OTC or rx for cholesterol. Never on statin.  BPH with LUTS,controlled - Reports chronic problem for >6 years. Takinggeneric silodosin8mg  dailygood results, previously discussed he was on brand rapaflo with some good results, due to cost switched, has maintained on this generic now with good results. Still has mild nocturia 1-2 times at night and some slight inc urgency at times otherwise reduced urinary frequency, still has some reduced strength to urinary stream at end.  Vitamin D deficiency Lab shows normal result. Continues onVitamin D3 1,000 daily   Health Maintenance  - Prostate cancer screening: No known personal or family history,lastPSA 0.5 (11/2019), last DRE reported prostate was only mildly enlarged, see above  BPH LUTS  - Colonoscopy last done 01/05/18 (Jonathan Ortega Jonathan Ortega) multiple polyp benign repeat 5 years, there was one polyp unable to retreive will need surgical resection, TBD currently scheduling. Next is 2024 - he has family history of esophageal cancer  Not yet up to date on COVID19 Vaccine. History of COVID19 diagnosed 10/2019 flu like symptoms. Did not receive antibody infusion. Admits some sinus symptoms persistent. Not taking CLaritin regularly. Never on nasal spray.  Depression screen [facility] 2/9 11/14/2019 10/23/2018 08/08/2018  Decreased Interest 0 0 0  Down, Depressed, Hopeless 0 0 0  PHQ - 2 Score 0 0 0    Past Medical History:  Diagnosis Date  . BPH associated with nocturia   . GERD (gastroesophageal reflux disease)   . Hemorrhoids   . History of anal fissures 1990s   s/p  repair  . Hypertension   . Hypertrophy, papillae, anal   . Mixed hyperlipidemia   . OA (osteoarthritis)   . Vitamin D deficiency   . Wears glasses    Past Surgical History:  Procedure Laterality Date  . COLONOSCOPY WITH PROPOFOL N/A 01/05/2018   Procedure: COLONOSCOPY WITH PROPOFOL;  Surgeon: Jonathan Manifold, Jonathan Ortega;  Location: [facility] ENDOSCOPY;  Service: Endoscopy;  Laterality: N/A;  . ESOPHAGOGASTRODUODENOSCOPY (EGD) WITH PROPOFOL N/A 01/05/2018   Procedure: ESOPHAGOGASTRODUODENOSCOPY (EGD) WITH PROPOFOL;  Surgeon: Jonathan Manifold, Jonathan Ortega;  Location: [facility] ENDOSCOPY;  Service: Endoscopy;  Laterality: N/A;  . EXCISION OF SKIN TAG N/A 12/28/2018   Procedure: EXCISION OF ANAL [place];  Surgeon: Jonathan Ruff, Jonathan Ortega;  Location[facility];  Service: General;  Laterality: N/A;  Social History   Socioeconomic History  . Marital status: Single    Spouse name: Not on file  . Number of children: Not on file  . Years of education: Not on file  . Highest education level: Not on file  Occupational History  . Not on file  Tobacco Use  . Smoking status: Never Smoker  . Smokeless tobacco: Never Used    Vaping Use  . Vaping Use: Never used  Substance and Sexual Activity  . Alcohol use: Yes    Alcohol/week: 0.0 standard drinks    Comment: occasional  . Drug use: Never  . Sexual activity: Not on file  Other Topics Concern  . Not on file  Social History Narrative  . Not on file   Social Determinants of Health   Financial Resource Strain:   . Difficulty of Paying Living Expenses:   Food Insecurity:   . Worried About Charity fundraiser in the Last Year:   . Arboriculturist in the Last Year:   Transportation Needs:   . Film/video editor (Medical):   [name] Lack of Transportation (Non-Medical):   Physical Activity:   . Days of Exercise per Week:   . Minutes of Exercise per Session:   Stress:   . Feeling of Stress :   Social Connections:   . Frequency of Communication with Friends and Family:   . Frequency of Social Gatherings with Friends and Family:   . Attends Religious Services:   . Active Member of Clubs or Organizations:   . Attends Archivist Meetings:   [name] Marital Status:   Intimate Partner Violence:   . Fear of Current or Ex-Partner:   . Emotionally Abused:   [name] Physically Abused:   . Sexually Abused:    Family History  Problem Relation Age of Onset  . Hypertension Paternal Uncle   . Hypertension Maternal Grandmother   . Ovarian cancer Jonathan Ortega   . Esophageal cancer Maternal Uncle    Current Outpatient Medications on File Prior to Visit  Medication Sig  . amLODipine (NORVASC) 5 MG tablet TAKE 1 TABLET BY MOUTH  DAILY  . aspirin EC 81 MG tablet Take 81 mg by mouth daily.  . cholecalciferol (VITAMIN D) 1000 UNITS tablet Take 1,000 Units by mouth daily.  . hydrocortisone (ANUSOL-HC) 25 MG suppository Place 1 suppository (25 mg total) rectally 2 (two) times daily. For 7 days  . ibuprofen (ADVIL,MOTRIN) 200 MG tablet Take 200 mg by mouth every 6 (six) hours as needed.  . loratadine (CLARITIN) 10 MG tablet Take 10 mg by mouth daily as needed.   . Multiple  Vitamin (MULTIVITAMIN) tablet Take 1 tablet by mouth daily.  [name] omeprazole (PRILOSEC) 20 MG capsule TAKE 1 CAPSULE BY MOUTH  DAILY BEFORE BREAKFAST  . Potassium 99 MG TABS Take by mouth.  . psyllium (METAMUCIL) 58.6 % packet Take 1 packet by mouth daily.  . silodosin (RAPAFLO) 8 MG CAPS capsule TAKE 1 CAPSULE BY MOUTH  DAILY WITH BREAKFAST   No current facility-administered medications on file prior to visit.    Review of Systems  Constitutional: Negative for activity change, appetite change, chills, diaphoresis, fatigue and fever.  HENT: Positive for congestion and postnasal drip. Negative for hearing loss.   Eyes: Negative for visual disturbance.  Respiratory: Negative for apnea, cough, chest tightness, shortness of breath and wheezing.   Cardiovascular: Negative for chest pain, palpitations and leg swelling.  Gastrointestinal: Negative for abdominal pain, anal bleeding, blood  in stool, constipation, diarrhea, nausea and vomiting.  Endocrine: Negative for cold intolerance.  Genitourinary: Negative for difficulty urinating, dysuria, frequency and hematuria.  Musculoskeletal: Negative for arthralgias, back pain and neck pain.  Skin: Negative for rash.  Allergic/Immunologic: Positive for environmental allergies.  Neurological: Negative for dizziness, weakness, light-headedness, numbness and headaches.  Hematological: Negative for adenopathy.  Psychiatric/Behavioral: Negative for behavioral problems, dysphoric mood and sleep disturbance. The patient is not nervous/anxious.    Per HPI unless specifically indicated above     Objective:    BP 127/81   Pulse 73   Temp (!) 97.5 F (36.4 C) (Temporal)   Resp 16   Ht 5\' 9"  (1.753 m)   Wt 200 lb (90.7 kg)   SpO2 98%   BMI 29.53 kg/m   Wt Readings from Last 3 Encounters:  11/14/19 200 lb (90.7 kg)  12/28/18 193 lb 3 oz (87.6 kg)  11/23/18 199 lb (90.3 kg)    Physical Exam Vitals and nursing note reviewed.  Constitutional:       General: He is not in acute distress.    Appearance: He is well-developed. He is not diaphoretic.     Comments: Well-appearing, comfortable, cooperative  HENT:     Head: Normocephalic and atraumatic.  Eyes:     General:        Right eye: No discharge.        Left eye: No discharge.     Conjunctiva/sclera: Conjunctivae normal.     Pupils: Pupils are equal, round, and reactive to light.  Neck:     Thyroid: No thyromegaly.     Vascular: No carotid bruit.  Cardiovascular:     Rate and Rhythm: Normal rate and regular rhythm.     Heart sounds: Normal heart sounds. No murmur heard.   Pulmonary:     Effort: Pulmonary effort is normal. No respiratory distress.     Breath sounds: Normal breath sounds. No wheezing or rales.  Abdominal:     General: Bowel sounds are normal. There is no distension.     Palpations: Abdomen is soft. There is no mass.     Tenderness: There is no abdominal tenderness.  Musculoskeletal:        General: No tenderness. Normal range of motion.     Cervical back: Normal range of motion and neck supple.     Comments: Upper / Lower Extremities: - Normal muscle tone, strength bilateral upper extremities 5/5, lower extremities 5/5  Lymphadenopathy:     Cervical: No cervical adenopathy.  Skin:    General: Skin is warm and dry.     Findings: No erythema or rash.  Neurological:     Mental Status: He is alert and oriented to person, place, and time.     Comments: Distal sensation intact to light touch all extremities  Psychiatric:        Behavior: Behavior normal.     Comments: Well groomed, good eye contact, normal speech and thoughts    Results for orders placed or performed in visit on 11/08/19  PSA  Result Value Ref Range   PSA 0.5 < OR = 4.0 ng/mL  Lipid panel  Result Value Ref Range   Cholesterol 169 <200 mg/dL   HDL 43 > OR = 40 mg/dL   Triglycerides 127 <150 mg/dL   LDL Cholesterol (Calc) 104 (H) mg/dL (calc)   Total CHOL/HDL Ratio 3.9 <5.0 (calc)    Non-HDL Cholesterol (Calc) 126 <130 mg/dL (calc)  COMPLETE METABOLIC PANEL WITH  GFR  Result Value Ref Range   Glucose, Bld 108 (H) 65 - 99 mg/dL   BUN 12 7 - 25 mg/dL   Creat 1.16 0.70 - 1.33 mg/dL   GFR, Est Non African American 70 > OR = 60 mL/min/1.60m2   GFR, Est African American 81 > OR = 60 mL/min/1.11m2   BUN/Creatinine Ratio NOT APPLICABLE 6 - 22 (calc)   Sodium 141 135 - 146 mmol/L   Potassium 4.0 3.5 - 5.3 mmol/L   Chloride 105 98 - 110 mmol/L   CO2 27 20 - 32 mmol/L   Calcium 8.8 8.6 - 10.3 mg/dL   Total Protein 6.5 6.1 - 8.1 g/dL   Albumin 4.1 3.6 - 5.1 g/dL   Globulin 2.4 1.9 - 3.7 g/dL (calc)   AG Ratio 1.7 1.0 - 2.5 (calc)   Total Bilirubin 0.6 0.2 - 1.2 mg/dL   Alkaline phosphatase (APISO) 85 35 - 144 U/L   AST 21 10 - 35 U/L   ALT 30 9 - 46 U/L  CBC with Differential/Platelet  Result Value Ref Range   WBC 7.2 3.8 - 10.8 Thousand/uL   RBC 4.70 4.20 - 5.80 Million/uL   Hemoglobin 15.3 13.2 - 17.1 g/dL   HCT 45.0 38 - 50 %   MCV 95.7 80.0 - 100.0 fL   MCH 32.6 27.0 - 33.0 pg   MCHC 34.0 32.0 - 36.0 g/dL   RDW 12.0 11.0 - 15.0 %   Platelets 142 140 - 400 Thousand/uL   MPV 10.5 7.5 - 12.5 fL   Neutro Abs 4,298 1,500 - 7,800 cells/uL   Lymphs Abs 1,606 850 - 3,900 cells/uL   Absolute Monocytes 720 200 - 950 cells/uL   Eosinophils Absolute 533 (H) 15 - 500 cells/uL   Basophils Absolute 43 0 - 200 cells/uL   Neutrophils Relative % 59.7 %   Total Lymphocyte 22.3 %   Monocytes Relative 10.0 %   Eosinophils Relative 7.4 %   Basophils Relative 0.6 %  Hemoglobin A1c  Result Value Ref Range   Hgb A1c MFr Bld 5.2 <5.7 % of total Hgb   Mean Plasma Glucose 103 (calc)   eAG (mmol/L) 5.7 (calc)      Assessment & Plan:   Problem List Items Addressed This Visit    Hyperlipidemia    Significantly improved lipid panel Not on statin Last lipid panel 11/2019 Calculated ASCVD 10 yr risk score 6.3%  Plan: 1. Discussion of ASCVD risk reduction statin vs ASA and review  guidelines on ASA use 2. Ultimately agree to continue ASA 81mg  for primary ASCVD risk reduction now at age 70 with low bleeding risk, may continue into age 53s - possibly switch to Statin in future 3. Encourage improved lifestyle - low carb/cholesterol, reduce portion size, continue improving regular exercise 4. Follow-up yearly lipid      Essential hypertension    Controlled HTN - Home BP readings normal No known complications    Plan:  1. Continue current BP regimen Amlodipine 5mg  daily 2. Encourage improved lifestyle - low sodium diet, regular exercise 3. Continue monitor BP outside office, bring readings to next visit, if persistently >140/90 or new symptoms notify office sooner      Elevated hemoglobin A1c    Improved A1c 5.2, now out of PreDM range Concern with obesity - improved wt  Plan:  1. Not on any therapy currently - remain off 2. Encourage improved lifestyle - low carb, low sugar diet, reduce portion size, continue improving  regular exercise 3. Follow-up 1 year for annual + A1c      BPH (benign prostatic hyperplasia)    Stable chronic BPH with controlled LUTS, w/o obstruction - AUA BPH score has been stable now 4-6 score - Last PSA 0.5 (11/2019) - prior 0.5 to 0.7 - Last DRE 2017, reported normal - No known personal/family history of prostate CA  Plan: 1. Continue generic Silodosin 8mg  daily  Follow-up as needed, future consider alternative Urology referral       Other Visit Diagnoses    Annual physical exam    -  Primary      Updated Health Maintenance information - PSA negative. Check yearly. - UTD Colonoscopy. Next due 2024. - Reviewed COVID19 vaccine recommendations. May get vaccine when ready or can delay since recent COVID, can offer antibody test if interested, not guaranteed accuracy Reviewed recent lab results with patient Encouraged improvement to lifestyle with diet and exercise - Goal of weight loss   No orders of the defined types were  placed in this encounter.   Follow up plan: Return in about 1 year (around 11/13/2020) for Annual Physical.  Future orders 11/11/19.  [name], [facility] 11/14/2019, 8:57 AM

## 2019-11-14 NOTE — Assessment & Plan Note (Addendum)
Controlled HTN - Home BP readings normal No known complications    Plan:  1. Continue current BP regimen Amlodipine 5mg  daily 2. Encourage improved lifestyle - low sodium diet, regular exercise 3. Continue monitor BP outside office, bring readings to next visit, if persistently >140/90 or new symptoms notify office sooner

## 2019-11-14 NOTE — Patient Instructions (Addendum)
Thank you for coming to the office today.  Start nasal steroid Flonase 2 sprays in each nostril daily for 4-6 weeks, may repeat course seasonally or as needed  ---------------------------------------------  [place] COVID19 Vaccine Information  Would recommend covid vaccine by 3-6 months after illness.  You can call [facility] and [facility] as well to check status and brand  LOCATION:  [facility] [place] [address]  Hours: Monday - Sunday 8:00am to 12:00pm  COVID-19 Vaccines By Appointment Only  Sign up for [place] List  [email or web address] or text "VACCINE" to [phone] or call [phone]  DUE for FASTING BLOOD WORK (no food or drink after midnight before the lab appointment, only water or coffee without cream/sugar on the morning of)  SCHEDULE "Lab Only" visit in the morning at the clinic for lab draw in 1 YEAR  - Make sure Lab Only appointment is at about 1 week before your next appointment, so that results will be available  For Lab Results, once available within 2-3 days of blood draw, you can can log in to MyChart online to view your results and a brief explanation. Also, we can discuss results at next follow-up visit.   Please schedule a Follow-up Appointment to: Return in about 1 year (around 11/13/2020) for Annual Physical.  If you have any other questions or concerns, please feel free to call the office or send a message through [name]. You may also schedule an earlier appointment if necessary.  Additionally, you may be receiving a survey about your experience at our office within a few days to 1 week by e-mail or mail. We value your feedback.  [name], DO [place]

## 2019-11-14 NOTE — Assessment & Plan Note (Signed)
Improved A1c 5.2, now out of PreDM range Concern with obesity - improved wt  Plan:  1. Not on any therapy currently - remain off 2. Encourage improved lifestyle - low carb, low sugar diet, reduce portion size, continue improving regular exercise 3. Follow-up 1 year for annual + A1c

## 2019-11-24 ENCOUNTER — Other Ambulatory Visit: Payer: Self-pay | Admitting: Family Medicine

## 2019-11-24 DIAGNOSIS — K219 Gastro-esophageal reflux disease without esophagitis: Secondary | ICD-10-CM

## 2019-11-24 NOTE — Telephone Encounter (Signed)
Requested Prescriptions  Pending Prescriptions Disp Refills  . omeprazole (PRILOSEC) 20 MG capsule [Pharmacy Med Name: OMEPRAZOLE  20MG   CAP] 90 capsule 3    Sig: TAKE 1 CAPSULE BY MOUTH  DAILY BEFORE BREAKFAST     Gastroenterology: Proton Pump Inhibitors Passed - 11/24/2019  5:19 AM      Passed - Valid encounter within last 12 months    Recent Outpatient Visits          1 week ago Annual physical exam   [place], DO   1 year ago Annual physical exam   [facility] [name], DO   1 year ago Essential hypertension   [place], DO   1 year ago Elevated BP without diagnosis of hypertension   [facility] [name], DO   2 years ago Annual physical exam   [place], DO      Future Appointments            In 11 months Parks Ranger, Devonne Doughty, [facility], [facility]

## 2020-10-14 LAB — HM DIABETES EYE EXAM

## 2020-10-23 ENCOUNTER — Other Ambulatory Visit: Payer: Self-pay | Admitting: Family Medicine

## 2020-10-23 DIAGNOSIS — K219 Gastro-esophageal reflux disease without esophagitis: Secondary | ICD-10-CM

## 2020-10-23 NOTE — Telephone Encounter (Signed)
Requested Prescriptions  Pending Prescriptions Disp Refills  . omeprazole (PRILOSEC) 20 MG capsule [Pharmacy Med Name: Omeprazole 20 MG Oral Capsule Delayed Release] 90 capsule 0    Sig: TAKE 1 CAPSULE BY MOUTH  DAILY BEFORE BREAKFAST     Gastroenterology: Proton Pump Inhibitors Passed - 10/23/2020  4:42 AM      Passed - Valid encounter within last 12 months    Recent Outpatient Visits          11 months ago Annual physical exam   [place], DO   2 years ago Annual physical exam   [facility] [name], DO   2 years ago Essential hypertension   [place], DO   2 years ago Elevated BP without diagnosis of hypertension   [facility] [name], DO   3 years ago Annual physical exam   [facility] [name], DO      Future Appointments            In 3 weeks Parks Ranger, Devonne Doughty, [facility], [facility]

## 2020-11-10 ENCOUNTER — Telehealth: Payer: Self-pay

## 2020-11-10 ENCOUNTER — Other Ambulatory Visit: Payer: 59

## 2020-11-10 ENCOUNTER — Other Ambulatory Visit: Payer: Self-pay

## 2020-11-10 DIAGNOSIS — I10 Essential (primary) hypertension: Secondary | ICD-10-CM

## 2020-11-10 DIAGNOSIS — R351 Nocturia: Secondary | ICD-10-CM

## 2020-11-10 DIAGNOSIS — Z0000 Encounter for general adult medical examination without abnormal findings: Secondary | ICD-10-CM

## 2020-11-10 DIAGNOSIS — E782 Mixed hyperlipidemia: Secondary | ICD-10-CM

## 2020-11-10 DIAGNOSIS — R7309 Other abnormal glucose: Secondary | ICD-10-CM

## 2020-11-10 DIAGNOSIS — N401 Enlarged prostate with lower urinary tract symptoms: Secondary | ICD-10-CM

## 2020-11-10 MED ORDER — AMLODIPINE BESYLATE 5 MG PO TABS: 1.0000 | ORAL_TABLET | Freq: Every day | ORAL | 3 refills | Status: DC

## 2020-11-10 NOTE — Telephone Encounter (Signed)
Sent to [facility]

## 2020-11-10 NOTE — Telephone Encounter (Signed)
Pt is requesting a refill on amlodipine  Mail order called in he will be out before his appt

## 2020-11-11 LAB — HEMOGLOBIN A1C
Hgb A1c MFr Bld: 5.5 % of total Hgb (ref <5.7)
Mean Plasma Glucose: 111 mg/dL
eAG (mmol/L): 6.2 mmol/L

## 2020-11-11 LAB — LIPID PANEL
Cholesterol: 168 mg/dL (ref <200)
HDL: 38 mg/dL — ABNORMAL LOW (ref >=40)
LDL Cholesterol (Calc): 110 mg/dL (calc) — ABNORMAL HIGH
Non-HDL Cholesterol (Calc): 130 mg/dL (calc) — ABNORMAL HIGH (ref <130)
Total CHOL/HDL Ratio: 4.4 (calc) (ref <5.0)
Triglycerides: 95 mg/dL (ref <150)

## 2020-11-11 LAB — COMPLETE METABOLIC PANEL WITH GFR
AG Ratio: 1.9 (calc) (ref 1.0–2.5)
ALT: 42 U/L (ref 9–46)
AST: 29 U/L (ref 10–35)
Albumin: 4.5 g/dL (ref 3.6–5.1)
Alkaline phosphatase (APISO): 69 U/L (ref 35–144)
BUN: 12 mg/dL (ref 7–25)
CO2: 27 mmol/L (ref 20–32)
Calcium: 9.2 mg/dL (ref 8.6–10.3)
Chloride: 104 mmol/L (ref 98–110)
Creat: 1.14 mg/dL (ref 0.70–1.33)
GFR, Est African American: 82 mL/min/{1.73_m2} (ref >=60)
GFR, Est Non African American: 70 mL/min/{1.73_m2} (ref >=60)
Globulin: 2.4 g/dL (calc) (ref 1.9–3.7)
Glucose, Bld: 104 mg/dL — ABNORMAL HIGH (ref 65–99)
Potassium: 4.2 mmol/L (ref 3.5–5.3)
Sodium: 140 mmol/L (ref 135–146)
Total Bilirubin: 1.6 mg/dL — ABNORMAL HIGH (ref 0.2–1.2)
Total Protein: 6.9 g/dL (ref 6.1–8.1)

## 2020-11-11 LAB — PSA: PSA: 0.56 ng/mL (ref <=4.00)

## 2020-11-11 LAB — CBC WITH DIFFERENTIAL/PLATELET
Absolute Monocytes: 725 cells/uL (ref 200–950)
Basophils Absolute: 62 cells/uL (ref 0–200)
Basophils Relative: 0.9 %
Eosinophils Absolute: 373 cells/uL (ref 15–500)
Eosinophils Relative: 5.4 %
HCT: 45.3 % (ref 38.5–50.0)
Hemoglobin: 15.4 g/dL (ref 13.2–17.1)
Lymphs Abs: 1656 cells/uL (ref 850–3900)
MCH: 32.2 pg (ref 27.0–33.0)
MCHC: 34 g/dL (ref 32.0–36.0)
MCV: 94.6 fL (ref 80.0–100.0)
MPV: 10.7 fL (ref 7.5–12.5)
Monocytes Relative: 10.5 %
Neutro Abs: 4085 cells/uL (ref 1500–7800)
Neutrophils Relative %: 59.2 %
Platelets: 170 10*3/uL (ref 140–400)
RBC: 4.79 10*6/uL (ref 4.20–5.80)
RDW: 11.8 % (ref 11.0–15.0)
Total Lymphocyte: 24 %
WBC: 6.9 10*3/uL (ref 3.8–10.8)

## 2020-11-17 ENCOUNTER — Ambulatory Visit (INDEPENDENT_AMBULATORY_CARE_PROVIDER_SITE_OTHER): Payer: 59 | Admitting: Family Medicine

## 2020-11-17 ENCOUNTER — Other Ambulatory Visit: Payer: Self-pay

## 2020-11-17 ENCOUNTER — Other Ambulatory Visit: Payer: Self-pay | Admitting: Family Medicine

## 2020-11-17 ENCOUNTER — Encounter: Payer: Self-pay | Admitting: Family Medicine

## 2020-11-17 VITALS — BP 134/72 | HR 67 | Ht 66.0 in | Wt 202.6 lb

## 2020-11-17 DIAGNOSIS — Z0000 Encounter for general adult medical examination without abnormal findings: Secondary | ICD-10-CM

## 2020-11-17 DIAGNOSIS — R7303 Prediabetes: Secondary | ICD-10-CM

## 2020-11-17 DIAGNOSIS — I10 Essential (primary) hypertension: Secondary | ICD-10-CM

## 2020-11-17 DIAGNOSIS — N401 Enlarged prostate with lower urinary tract symptoms: Secondary | ICD-10-CM

## 2020-11-17 DIAGNOSIS — K219 Gastro-esophageal reflux disease without esophagitis: Secondary | ICD-10-CM

## 2020-11-17 DIAGNOSIS — E782 Mixed hyperlipidemia: Secondary | ICD-10-CM

## 2020-11-17 DIAGNOSIS — R351 Nocturia: Secondary | ICD-10-CM

## 2020-11-17 MED ORDER — SILODOSIN 8 MG PO CAPS: 8.0000 mg | ORAL_CAPSULE | Freq: Every day | ORAL | 3 refills | Status: DC

## 2020-11-17 MED ORDER — OMEPRAZOLE 20 MG PO CPDR: DELAYED_RELEASE_CAPSULE | ORAL | 3 refills | Status: DC

## 2020-11-17 NOTE — Patient Instructions (Addendum)
Thank you for coming to the office today.  Consider Rosuvastatin Crestor generic for cholesterol and reducing cardiovascular risk age 59+  You have plenty of refills, if you need [place] to switch to other location, let us know.  DUE for FASTING BLOOD WORK (no food or drink after midnight before the lab appointment, only water or coffee without cream/sugar on the morning of)  SCHEDULE "Lab Only" visit in the morning at the clinic for lab draw in 1 YEAR  - Make sure Lab Only appointment is at about 1 week before your next appointment, so that results will be available  For Lab Results, once available within 2-3 days of blood draw, you can can log in to MyChart online to view your results and a brief explanation. Also, we can discuss results at next follow-up visit.     Please schedule a Follow-up Appointment to: Return in about 1 year (around 11/17/2021) for 1 year fasting lab only then 1 week later Annual Physical.  If you have any other questions or concerns, please feel free to call the office or send a message through [place]. You may also schedule an earlier appointment if necessary.  Additionally, you may be receiving a survey about your experience at our office within a few days to 1 week by e-mail or mail. We value your feedback.  [name], DO [place]

## 2020-11-17 NOTE — Progress Notes (Signed)
Subjective:    Patient ID: Jonathan Ortega, male    DOB: [birth year], 60 y.o.   MRN: [id number]  Jonathan Ortega is a 60 y.o. male presenting on 11/17/2020 for Annual Exam   HPI  Here for Annual Physical and Lab Review.   FOLLOW-UP GERD: Last EGD 01/05/18 done by [facility]. Showed gastrc polyps negative and some mild erythema. He was on H2 Zantac but then recalled, now he remains on PPI Omeprazole 20mg  daily doing well - Continues on this. Mother's side with maternal uncle of family with esophageal cancer   Pre-Diabetes History of Background Diabetic Retinopathy Last A1c up to 5.5 now. - Never on medication Lifestyle: - Weight stable some slight gain - Diet: Improving reduce sugar / lower carb healthy diet, rare soda, drinks unsweet tea, water, coffee - Exercise: Walking up to 4 miles daily - No significant family history of DM [facility] report 10/14/20 - background DM retinopathy but no treatment required. Denies hypoglycemia   CHRONIC HTN: Reports no new concerns. BP readings normal Current Meds - Amlodipine 5mg  daily   Reports good compliance, took meds today. Tolerating well, w/o complaints.   HYPERLIPIDEMIA: - Reports no concerns. Last lipids 11/2020, with stable to improved LDL but still >100 Not on statin - Taking ASA 81mg  daily for primary prevention. No known CAD / MI / CVA - Not taking any medicines OTC or rx for cholesterol. Never on statin.   BPH with LUTS, controlled - Reports chronic problem for >6 years. Taking generic silodosin 8mg  daily good results, previously discussed he was on brand rapaflo with some good results, due to cost switched, has maintained on this generic now with good results. Still has mild nocturia 1-2 times at night and some slight inc urgency at times otherwise reduced urinary frequency, still has some reduced strength to urinary stream at end.   Vitamin D deficiency Continues onVitamin D3 1,000 daily      Health Maintenance   - Prostate  cancer screening: No known personal or family history, last PSA 0.56 (11/2020), last DRE reported prostate was only mildly enlarged, see above BPH LUTS   - Colonoscopy last done 01/05/18 (Dr Bonna Gains [facility]) multiple polyp benign repeat 5 years, there was one polyp unable to retreive will need surgical resection, TBD currently scheduling. Next is 2024 - he has family history of esophageal cancer    Depression screen [facility] 2/9 11/17/2020 11/14/2019 10/23/2018  Decreased Interest 0 0 0  Down, Depressed, Hopeless 0 0 0  PHQ - 2 Score 0 0 0  Altered sleeping 0 - -  Tired, decreased energy 0 - -  Change in appetite 0 - -  Feeling bad or failure about yourself  0 - -  Trouble concentrating 0 - -  Moving slowly or fidgety/restless 0 - -  Suicidal thoughts 0 - -  PHQ-9 Score 0 - -  Difficult doing work/chores Not difficult at all - -    Past Medical History:  Diagnosis Date   BPH associated with nocturia    GERD (gastroesophageal reflux disease)    Hemorrhoids    History of anal fissures 1990s   s/p  repair   Hypertension    Hypertrophy, papillae, anal    Mixed hyperlipidemia    OA (osteoarthritis)    Vitamin D deficiency    Wears glasses    Past Surgical History:  Procedure Laterality Date   COLONOSCOPY WITH PROPOFOL N/A 01/05/2018   Procedure: COLONOSCOPY WITH PROPOFOL;  Surgeon: Bonna Gains,  Lennette Bihari, MD;  Location: [facility] ENDOSCOPY;  Service: Endoscopy;  Laterality: N/A;   ESOPHAGOGASTRODUODENOSCOPY (EGD) WITH PROPOFOL N/A 01/05/2018   Procedure: ESOPHAGOGASTRODUODENOSCOPY (EGD) WITH PROPOFOL;  Surgeon: Virgel Manifold, MD;  Location: [facility] ENDOSCOPY;  Service: Endoscopy;  Laterality: N/A;   EXCISION OF SKIN TAG N/A 12/28/2018   Procedure: EXCISION OF ANAL [place];  Surgeon: Leighton Ruff, MD;  Location: [facility];  Service: General;  Laterality: N/A;   Social History   Socioeconomic History   Marital status: Single    Spouse name: Not on file   Number of children:  Not on file   Years of education: Not on file   Highest education level: Not on file  Occupational History   Not on file  Tobacco Use   Smoking status: Never   Smokeless tobacco: Never  Vaping Use   Vaping Use: Never used  Substance and Sexual Activity   Alcohol use: Yes    Alcohol/week: 0.0 standard drinks    Comment: occasional   Drug use: Never   Sexual activity: Not on file  Other Topics Concern   Not on file  Social History Narrative   Not on file   Social Determinants of Health   Financial Resource Strain: Not on file  Food Insecurity: Not on file  Transportation Needs: Not on file  Physical Activity: Not on file  Stress: Not on file  Social Connections: Not on file  Intimate Partner Violence: Not on file   Family History  Problem Relation Age of Onset   Hypertension Paternal Uncle    Hypertension Maternal Grandmother    Ovarian cancer Mother    Esophageal cancer Maternal Uncle    Current Outpatient Medications on File Prior to Visit  Medication Sig   amLODipine (NORVASC) 5 MG tablet Take 1 tablet (5 mg total) by mouth daily.   aspirin EC 81 MG tablet Take 81 mg by mouth daily.   cholecalciferol (VITAMIN D) 1000 UNITS tablet Take 1,000 Units by mouth daily.   hydrocortisone (ANUSOL-HC) 25 MG suppository Place 1 suppository (25 mg total) rectally 2 (two) times daily. For 7 days   ibuprofen (ADVIL,MOTRIN) 200 MG tablet Take 200 mg by mouth every 6 (six) hours as needed.   loratadine (CLARITIN) 10 MG tablet Take 10 mg by mouth daily as needed.    Multiple Vitamin (MULTIVITAMIN) tablet Take 1 tablet by mouth daily.   Potassium 99 MG TABS Take by mouth.   psyllium (METAMUCIL) 58.6 % packet Take 1 packet by mouth daily.   No current facility-administered medications on file prior to visit.    Review of Systems  Constitutional:  Negative for activity change, appetite change, chills, diaphoresis, fatigue and fever.  HENT:  Negative for congestion and hearing loss.    Eyes:  Negative for visual disturbance.  Respiratory:  Negative for cough, chest tightness, shortness of breath and wheezing.   Cardiovascular:  Negative for chest pain, palpitations and leg swelling.  Gastrointestinal:  Negative for abdominal pain, constipation, diarrhea, nausea and vomiting.  Genitourinary:  Negative for dysuria, frequency and hematuria.  Musculoskeletal:  Negative for arthralgias and neck pain.  Skin:  Negative for rash.  Neurological:  Negative for dizziness, weakness, light-headedness, numbness and headaches.  Hematological:  Negative for adenopathy.  Psychiatric/Behavioral:  Negative for behavioral problems, dysphoric mood and sleep disturbance.   Per HPI unless specifically indicated above      Objective:    BP 134/72   Pulse 67   Ht 5'  6" (1.676 m)   Wt 202 lb 9.6 oz (91.9 kg)   SpO2 99%   BMI 32.70 kg/m   Wt Readings from Last 3 Encounters:  11/17/20 202 lb 9.6 oz (91.9 kg)  11/14/19 200 lb (90.7 kg)  12/28/18 193 lb 3 oz (87.6 kg)    Physical Exam Vitals and nursing note reviewed.  Constitutional:      General: He is not in acute distress.    Appearance: He is well-developed. He is not diaphoretic.     Comments: Well-appearing, comfortable, cooperative  HENT:     Head: Normocephalic and atraumatic.  Eyes:     General:        Right eye: No discharge.        Left eye: No discharge.     Conjunctiva/sclera: Conjunctivae normal.     Pupils: Pupils are equal, round, and reactive to light.  Neck:     Thyroid: No thyromegaly.     Vascular: No carotid bruit.  Cardiovascular:     Rate and Rhythm: Normal rate and regular rhythm.     Pulses: Normal pulses.     Heart sounds: Normal heart sounds. No murmur heard. Pulmonary:     Effort: Pulmonary effort is normal. No respiratory distress.     Breath sounds: Normal breath sounds. No wheezing or rales.  Abdominal:     General: Bowel sounds are normal. There is no distension.     Palpations: Abdomen  is soft. There is no mass.     Tenderness: There is no abdominal tenderness.  Musculoskeletal:        General: No tenderness. Normal range of motion.     Cervical back: Normal range of motion and neck supple.     Comments: Upper / Lower Extremities: - Normal muscle tone, strength bilateral upper extremities 5/5, lower extremities 5/5  Lymphadenopathy:     Cervical: No cervical adenopathy.  Skin:    General: Skin is warm and dry.     Findings: No erythema or rash.  Neurological:     Mental Status: He is alert and oriented to person, place, and time.     Comments: Distal sensation intact to light touch all extremities  Psychiatric:        Mood and Affect: Mood normal.        Behavior: Behavior normal.        Thought Content: Thought content normal.     Comments: Well groomed, good eye contact, normal speech and thoughts     Results for orders placed or performed in visit on 11/10/20  PSA  Result Value Ref Range   PSA 0.56 < OR = 4.00 ng/mL  Lipid panel  Result Value Ref Range   Cholesterol 168 <200 mg/dL   HDL 38 (L) > OR = 40 mg/dL   Triglycerides 95 <150 mg/dL   LDL Cholesterol (Calc) 110 (H) mg/dL (calc)   Total CHOL/HDL Ratio 4.4 <5.0 (calc)   Non-HDL Cholesterol (Calc) 130 (H) <130 mg/dL (calc)  COMPLETE METABOLIC PANEL WITH GFR  Result Value Ref Range   Glucose, Bld 104 (H) 65 - 99 mg/dL   BUN 12 7 - 25 mg/dL   Creat 1.14 0.70 - 1.33 mg/dL   GFR, Est Non African American 70 > OR = 60 mL/min/1.22m2   GFR, Est African American 82 > OR = 60 mL/min/1.25m2   BUN/Creatinine Ratio NOT APPLICABLE 6 - 22 (calc)   Sodium 140 135 - 146 mmol/L   Potassium 4.2 3.5 -  5.3 mmol/L   Chloride 104 98 - 110 mmol/L   CO2 27 20 - 32 mmol/L   Calcium 9.2 8.6 - 10.3 mg/dL   Total Protein 6.9 6.1 - 8.1 g/dL   Albumin 4.5 3.6 - 5.1 g/dL   Globulin 2.4 1.9 - 3.7 g/dL (calc)   AG Ratio 1.9 1.0 - 2.5 (calc)   Total Bilirubin 1.6 (H) 0.2 - 1.2 mg/dL   Alkaline phosphatase (APISO) 69 35 -  144 U/L   AST 29 10 - 35 U/L   ALT 42 9 - 46 U/L  CBC with Differential/Platelet  Result Value Ref Range   WBC 6.9 3.8 - 10.8 Thousand/uL   RBC 4.79 4.20 - 5.80 Million/uL   Hemoglobin 15.4 13.2 - 17.1 g/dL   HCT 45.3 38.5 - 50.0 %   MCV 94.6 80.0 - 100.0 fL   MCH 32.2 27.0 - 33.0 pg   MCHC 34.0 32.0 - 36.0 g/dL   RDW 11.8 11.0 - 15.0 %   Platelets 170 140 - 400 Thousand/uL   MPV 10.7 7.5 - 12.5 fL   Neutro Abs 4,085 1,500 - 7,800 cells/uL   Lymphs Abs 1,656 850 - 3,900 cells/uL   Absolute Monocytes 725 200 - 950 cells/uL   Eosinophils Absolute 373 15 - 500 cells/uL   Basophils Absolute 62 0 - 200 cells/uL   Neutrophils Relative % 59.2 %   Total Lymphocyte 24.0 %   Monocytes Relative 10.5 %   Eosinophils Relative 5.4 %   Basophils Relative 0.9 %  Hemoglobin A1c  Result Value Ref Range   Hgb A1c MFr Bld 5.5 <5.7 % of total Hgb   Mean Plasma Glucose 111 mg/dL   eAG (mmol/L) 6.2 mmol/L      Assessment & Plan:   Problem List Items Addressed This Visit     Pre-diabetes    Mild elevated A1c 5.5, now out of PreDM range Concern with obesity - improved wt  Plan:  1. Not on any therapy currently - remain off 2. Encourage improved lifestyle - low carb, low sugar diet, reduce portion size, continue improving regular exercise 3. Follow-up 1 year for annual + A1c       Hyperlipidemia    Significantly improved lipid panel Not on statin Last lipid panel 11/2020 The 10-year ASCVD risk score Mikey Bussing DC Jr., et al., 2013) is: 9.6%  Plan: 1. Discussion of ASCVD risk reduction statin vs ASA and review guidelines on ASA use 2. Ultimately agree to continue ASA 81mg  for primary ASCVD risk reduction now at age 54 with low bleeding risk, may continue into age 78s - possibly switch to Statin in future 3. Encourage improved lifestyle - low carb/cholesterol, reduce portion size, continue improving regular exercise 4. Follow-up yearly lipid       GERD (gastroesophageal reflux disease)    Relevant Medications   omeprazole (PRILOSEC) 20 MG capsule   Essential hypertension    Controlled HTN - Home BP readings normal No known complications    Plan:  1. Continue current BP regimen Amlodipine 5mg  daily 2. Encourage improved lifestyle - low sodium diet, regular exercise 3. Continue monitor BP outside office, bring readings to next visit, if persistently >140/90 or new symptoms notify office sooner       BPH (benign prostatic hyperplasia)    Stable chronic BPH with mostly controlled has some weaker urinary stream at time. - Last PSA 0.56 (11/2020) - prior 0.5 to 0.7 - Last DRE 2017, reported normal - No known  personal/family history of prostate CA  Plan: 1. Continue generic Silodosin 8mg  daily - refill  Follow-up as needed, future consider alternative Urology referral       Relevant Medications   silodosin (RAPAFLO) 8 MG CAPS capsule   Other Visit Diagnoses     Annual physical exam    -  Primary       Updated Health Maintenance information Reviewed recent lab results with patient Encouraged improvement to lifestyle with diet and exercise Goal of weight loss   Meds ordered this encounter  Medications   silodosin (RAPAFLO) 8 MG CAPS capsule    Sig: Take 1 capsule (8 mg total) by mouth daily with breakfast.    Dispense:  90 capsule    Refill:  3    Requesting 1 year supply   omeprazole (PRILOSEC) 20 MG capsule    Sig: TAKE 1 CAPSULE BY MOUTH  DAILY BEFORE BREAKFAST    Dispense:  90 capsule    Refill:  3    Requesting 1 year supply    Follow up plan: Return in about 1 year (around 11/17/2021) for 1 year fasting lab only then 1 week later Annual Physical.  Future labs 11/16/21  [name], [facility] 11/17/2020, 9:14 AM

## 2020-11-17 NOTE — Assessment & Plan Note (Signed)
Controlled HTN - Home BP readings normal No known complications    Plan:  1. Continue current BP regimen Amlodipine 5mg  daily 2. Encourage improved lifestyle - low sodium diet, regular exercise 3. Continue monitor BP outside office, bring readings to next visit, if persistently >140/90 or new symptoms notify office sooner

## 2020-11-17 NOTE — Assessment & Plan Note (Signed)
Mild elevated A1c 5.5, now out of PreDM range Concern with obesity - improved wt  Plan:  1. Not on any therapy currently - remain off 2. Encourage improved lifestyle - low carb, low sugar diet, reduce portion size, continue improving regular exercise 3. Follow-up 1 year for annual + A1c

## 2020-11-17 NOTE — Assessment & Plan Note (Signed)
Significantly improved lipid panel Not on statin Last lipid panel 11/2020 The 10-year ASCVD risk score Mikey Bussing DC Jr., et al., 2013) is: 9.6%  Plan: 1. Discussion of ASCVD risk reduction statin vs ASA and review guidelines on ASA use 2. Ultimately agree to continue ASA 81mg  for primary ASCVD risk reduction now at age 60 with low bleeding risk, may continue into age 64s - possibly switch to Statin in future 3. Encourage improved lifestyle - low carb/cholesterol, reduce portion size, continue improving regular exercise 4. Follow-up yearly lipid

## 2020-11-17 NOTE — Assessment & Plan Note (Signed)
Stable chronic BPH with mostly controlled has some weaker urinary stream at time. - Last PSA 0.56 (11/2020) - prior 0.5 to 0.7 - Last DRE 2017, reported normal - No known personal/family history of prostate CA  Plan: 1. Continue generic Silodosin 8mg  daily - refill  Follow-up as needed, future consider alternative Urology referral

## 2021-04-08 ENCOUNTER — Other Ambulatory Visit: Payer: Self-pay | Admitting: Family Medicine

## 2021-04-08 DIAGNOSIS — R351 Nocturia: Secondary | ICD-10-CM

## 2021-04-08 DIAGNOSIS — N401 Enlarged prostate with lower urinary tract symptoms: Secondary | ICD-10-CM

## 2021-04-08 DIAGNOSIS — I10 Essential (primary) hypertension: Secondary | ICD-10-CM

## 2021-04-08 DIAGNOSIS — K219 Gastro-esophageal reflux disease without esophagitis: Secondary | ICD-10-CM

## 2021-04-08 MED ORDER — SILODOSIN 8 MG PO CAPS: 8.0000 mg | ORAL_CAPSULE | Freq: Every day | ORAL | 1 refills | Status: DC

## 2021-04-08 MED ORDER — AMLODIPINE BESYLATE 5 MG PO TABS: 5.0000 mg | ORAL_TABLET | Freq: Every day | ORAL | 1 refills | Status: DC

## 2021-04-08 MED ORDER — OMEPRAZOLE 20 MG PO CPDR: DELAYED_RELEASE_CAPSULE | ORAL | 1 refills | Status: DC

## 2021-04-08 NOTE — Telephone Encounter (Signed)
Copied from [place] [id number]. Topic: Quick Communication - Rx Refill/Question >> Apr 08, 2021 11:11 AM Tessa Lerner A wrote: Medication: amLODipine (NORVASC) 5 MG tablet [[id number]]   silodosin (RAPAFLO) 8 MG CAPS capsule [[id number]] - patient has 0 (zero) tablets remaining   omeprazole (PRILOSEC) 20 MG capsule [[id number]]   Has the patient contacted their pharmacy? Yes.  This will be the patient's first time using this pharmacy  (Agent: If no, request that the patient contact the pharmacy for the refill. If patient does not wish to contact the pharmacy document the reason why and proceed with request.) (Agent: If yes, when and what did the pharmacy advise?)  Preferred Pharmacy (with phone number or street name): [facility] # [place], [place]  Phone:  [phone] Fax:  [phone]  Has the patient been seen for an appointment in the last year OR does the patient have an upcoming appointment? Yes.    Agent: Please be advised that RX refills may take up to 3 business days. We ask that you follow-up with your pharmacy.

## 2021-04-08 NOTE — Telephone Encounter (Signed)
Requested Prescriptions  Pending Prescriptions Disp Refills  . amLODipine (NORVASC) 5 MG tablet 90 tablet 1    Sig: Take 1 tablet (5 mg total) by mouth daily.     Cardiovascular:  Calcium Channel Blockers Passed - 04/08/2021 11:26 AM      Passed - Last BP in normal range    BP Readings from Last 1 Encounters:  11/17/20 134/72         Passed - Valid encounter within last 6 months    Recent Outpatient Visits          4 months ago Annual physical exam   [place], DO   1 year ago Annual physical exam   [facility] [name], DO   2 years ago Annual physical exam   [facility] [name], DO   2 years ago Essential hypertension   [place], DO   2 years ago Elevated BP without diagnosis of hypertension   [place], DO      Future Appointments            In 7 months Parks Ranger, Devonne Doughty, DO [facility], [place]           . omeprazole (PRILOSEC) 20 MG capsule 90 capsule 1    Sig: TAKE 1 CAPSULE BY MOUTH  DAILY BEFORE BREAKFAST     Gastroenterology: Proton Pump Inhibitors Passed - 04/08/2021 11:26 AM      Passed - Valid encounter within last 12 months    Recent Outpatient Visits          4 months ago Annual physical exam   [place], DO   1 year ago Annual physical exam   [facility] [name], DO   2 years ago Annual physical exam   [facility] [name], DO   2 years ago Essential hypertension   [place], DO   2 years ago Elevated BP without diagnosis of hypertension   [place], DO      Future Appointments            In 7 months Parks Ranger, Devonne Doughty, DO [facility]           . silodosin (RAPAFLO) 8 MG CAPS capsule 90 capsule 1    Sig: Take 1 capsule (8 mg total) by mouth daily with breakfast.     Urology: Alpha-Adrenergic Blocker Passed - 04/08/2021 11:26 AM      Passed - Last BP in normal range    BP Readings from Last 1 Encounters:  11/17/20 134/72         Passed - Valid encounter within last 12 months    Recent Outpatient Visits          4 months ago Annual physical exam   [facility] [name], DO   1 year ago Annual physical exam   [facility] [name], DO   2 years ago Annual physical exam   [facility] [name], DO   2 years ago Essential hypertension   [place], DO   2 years ago Elevated BP without diagnosis of hypertension   [place], DO      Future Appointments            In 7 months Parks Ranger, Devonne Doughty, DO [facility], [facility]

## 2021-10-11 ENCOUNTER — Other Ambulatory Visit: Payer: Self-pay | Admitting: Family Medicine

## 2021-10-11 DIAGNOSIS — I10 Essential (primary) hypertension: Secondary | ICD-10-CM

## 2021-10-11 DIAGNOSIS — N401 Enlarged prostate with lower urinary tract symptoms: Secondary | ICD-10-CM

## 2021-10-12 NOTE — Telephone Encounter (Signed)
Requested Prescriptions  ?Pending Prescriptions Disp Refills  ?? silodosin (RAPAFLO) 8 MG CAPS capsule [Pharmacy Med Name: Silodosin Oral Capsule 8 MG] 90 capsule 0  ?  Sig: TAKE ONE CAPSULE BY MOUTH ONE TIME DAILY with breakfast  ?  ? Urology: Alpha-Adrenergic Blocker 2 Passed - 10/11/2021 11:54 AM  ?  ?  Passed - PSA in normal range and within 360 days  ?  PSA  ?Date Value Ref Range Status  ?11/10/2020 0.56 < OR = 4.00 ng/mL Final  ?  Comment:  ?  The total PSA value from this assay system is  ?standardized against the WHO standard. The test  ?result will be approximately 20% lower when compared  ?to the equimolar-standardized total PSA (Beckman  ?Coulter). Comparison of serial PSA results should be  ?interpreted with this fact in mind. ?. ?This test was performed using the Siemens  ?chemiluminescent method. Values obtained from  ?different assay methods cannot be used ?interchangeably. PSA levels, regardless of ?value, should not be interpreted as absolute ?evidence of the presence or absence of disease. ?  ? ?Prostate Specific Ag, Serum  ?Date Value Ref Range Status  ?04/21/2015 0.7 0.0 - 4.0 ng/mL Final  ?  Comment:  ?  Roche ECLIA methodology. ?According to the American Urological Association, Serum PSA should ?decrease and remain at undetectable levels after radical ?prostatectomy. The AUA defines biochemical recurrence as an initial ?PSA value 0.2 ng/mL or greater followed by a subsequent confirmatory ?PSA value 0.2 ng/mL or greater. ?Values obtained with different assay methods or kits cannot be used ?interchangeably. Results cannot be interpreted as absolute evidence ?of the presence or absence of malignant disease. ?  ?   ?  ?  Passed - Cr in normal range and within 360 days  ?  Creat  ?Date Value Ref Range Status  ?11/10/2020 1.14 0.70 - 1.33 mg/dL Final  ?  Comment:  ?  For patients >39 years of age, the reference limit ?for Creatinine is approximately 13% higher for people ?identified as  African-American. ?. ?  ?   ?  ?  Passed - ALT in normal range and within 360 days  ?  ALT  ?Date Value Ref Range Status  ?11/10/2020 42 9 - 46 U/L Final  ?   ?  ?  Passed - AST in normal range and within 360 days  ?  AST  ?Date Value Ref Range Status  ?11/10/2020 29 10 - 35 U/L Final  ?   ?  ?  Passed - Last BP in normal range  ?  BP Readings from Last 1 Encounters:  ?11/17/20 134/72  ?   ?  ?  Passed - Valid encounter within last 12 months  ?  Recent Outpatient Visits   ?      ? 10 months ago Annual physical exam  ? [facility] [name], DO  ? 1 year ago Annual physical exam  ? [place], DO  ? 2 years ago Annual physical exam  ? [facility] [name], DO  ? 3 years ago Essential hypertension  ? [place], DO  ? 3 years ago Elevated BP without diagnosis of hypertension  ? [place], DO  ?  ?  ?Future Appointments   ?        ? In 1 month [place], Devonne Doughty, [facility]  ?  ? ?  ?  ?  ?? amLODipine (NORVASC) 5 MG tablet [Pharmacy Med Name: amLODIPine [place] Oral Tablet 5 MG] 90 tablet 0  ?  Sig: TAKE ONE TABLET BY MOUTH ONE TIME DAILY  ?  ? Cardiovascular: Calcium Channel Blockers 2 Failed - 10/11/2021 11:54 AM  ?  ?  Failed - Valid encounter within last 6 months  ?  Recent Outpatient Visits   ?      ? 10 months ago Annual physical exam  ? [facility] [name], DO  ? 1 year ago Annual physical exam  ? [place], DO  ? 2 years ago Annual physical exam  ? [facility] [name], DO  ? 3 years ago Essential hypertension  ? [place], DO  ? 3 years ago Elevated BP without diagnosis of hypertension  ? [place], DO   ?  ?  ?Future Appointments   ?        ? In 1 month Karamalegos, [facility]  ?  ? ?  ?  ?  Passed - Last BP in normal range  ?  BP Readings from Last 1 Encounters:  ?11/17/20 134/72  ?   ?  ?  Passed - Last Heart Rate in normal range  ?  Pulse Readings from Last 1 Encounters:  ?11/17/20 67  ?   ?  ?  ? ?

## 2021-11-15 ENCOUNTER — Other Ambulatory Visit: Payer: Self-pay

## 2021-11-15 DIAGNOSIS — E782 Mixed hyperlipidemia: Secondary | ICD-10-CM

## 2021-11-15 DIAGNOSIS — K219 Gastro-esophageal reflux disease without esophagitis: Secondary | ICD-10-CM

## 2021-11-15 DIAGNOSIS — R7303 Prediabetes: Secondary | ICD-10-CM

## 2021-11-15 DIAGNOSIS — N401 Enlarged prostate with lower urinary tract symptoms: Secondary | ICD-10-CM

## 2021-11-15 DIAGNOSIS — Z0000 Encounter for general adult medical examination without abnormal findings: Secondary | ICD-10-CM

## 2021-11-15 DIAGNOSIS — I10 Essential (primary) hypertension: Secondary | ICD-10-CM

## 2021-11-16 ENCOUNTER — Other Ambulatory Visit: Payer: Self-pay

## 2021-11-16 DIAGNOSIS — I10 Essential (primary) hypertension: Secondary | ICD-10-CM | POA: Diagnosis not present

## 2021-11-16 DIAGNOSIS — K219 Gastro-esophageal reflux disease without esophagitis: Secondary | ICD-10-CM | POA: Diagnosis not present

## 2021-11-16 DIAGNOSIS — Z0000 Encounter for general adult medical examination without abnormal findings: Secondary | ICD-10-CM | POA: Diagnosis not present

## 2021-11-16 DIAGNOSIS — E782 Mixed hyperlipidemia: Secondary | ICD-10-CM | POA: Diagnosis not present

## 2021-11-16 DIAGNOSIS — N401 Enlarged prostate with lower urinary tract symptoms: Secondary | ICD-10-CM | POA: Diagnosis not present

## 2021-11-16 DIAGNOSIS — R7303 Prediabetes: Secondary | ICD-10-CM | POA: Diagnosis not present

## 2021-11-16 DIAGNOSIS — R351 Nocturia: Secondary | ICD-10-CM | POA: Diagnosis not present

## 2021-11-17 LAB — CBC WITH DIFFERENTIAL/PLATELET
Absolute Monocytes: 645 cells/uL (ref 200–950)
Basophils Absolute: 50 cells/uL (ref 0–200)
Basophils Relative: 0.8 %
Eosinophils Absolute: 316 cells/uL (ref 15–500)
Eosinophils Relative: 5.1 %
HCT: 46.2 % (ref 38.5–50.0)
Hemoglobin: 15.5 g/dL (ref 13.2–17.1)
Lymphs Abs: 1662 cells/uL (ref 850–3900)
MCH: 31.8 pg (ref 27.0–33.0)
MCHC: 33.5 g/dL (ref 32.0–36.0)
MCV: 94.9 fL (ref 80.0–100.0)
MPV: 10.5 fL (ref 7.5–12.5)
Monocytes Relative: 10.4 %
Neutro Abs: 3528 cells/uL (ref 1500–7800)
Neutrophils Relative %: 56.9 %
Platelets: 151 10*3/uL (ref 140–400)
RBC: 4.87 10*6/uL (ref 4.20–5.80)
RDW: 11.8 % (ref 11.0–15.0)
Total Lymphocyte: 26.8 %
WBC: 6.2 10*3/uL (ref 3.8–10.8)

## 2021-11-17 LAB — COMPLETE METABOLIC PANEL WITH GFR
AG Ratio: 1.6 (calc) (ref 1.0–2.5)
ALT: 40 U/L (ref 9–46)
AST: 27 U/L (ref 10–35)
Albumin: 4.3 g/dL (ref 3.6–5.1)
Alkaline phosphatase (APISO): 79 U/L (ref 35–144)
BUN: 15 mg/dL (ref 7–25)
CO2: 28 mmol/L (ref 20–32)
Calcium: 9 mg/dL (ref 8.6–10.3)
Chloride: 106 mmol/L (ref 98–110)
Creat: 1.18 mg/dL (ref 0.70–1.30)
Globulin: 2.7 g/dL (calc) (ref 1.9–3.7)
Glucose, Bld: 95 mg/dL (ref 65–99)
Potassium: 4.4 mmol/L (ref 3.5–5.3)
Sodium: 142 mmol/L (ref 135–146)
Total Bilirubin: 1.4 mg/dL — ABNORMAL HIGH (ref 0.2–1.2)
Total Protein: 7 g/dL (ref 6.1–8.1)
eGFR: 71 mL/min/{1.73_m2} (ref >=60)

## 2021-11-17 LAB — LIPID PANEL
Cholesterol: 172 mg/dL (ref <200)
HDL: 37 mg/dL — ABNORMAL LOW (ref >=40)
LDL Cholesterol (Calc): 113 mg/dL (calc) — ABNORMAL HIGH
Non-HDL Cholesterol (Calc): 135 mg/dL (calc) — ABNORMAL HIGH (ref <130)
Total CHOL/HDL Ratio: 4.6 (calc) (ref <5.0)
Triglycerides: 116 mg/dL (ref <150)

## 2021-11-17 LAB — HEMOGLOBIN A1C
Hgb A1c MFr Bld: 5.4 % of total Hgb (ref <5.7)
Mean Plasma Glucose: 108 mg/dL
eAG (mmol/L): 6 mmol/L

## 2021-11-17 LAB — PSA: PSA: 0.57 ng/mL (ref <=4.00)

## 2021-11-23 ENCOUNTER — Encounter: Payer: Self-pay | Admitting: Family Medicine

## 2021-11-23 ENCOUNTER — Ambulatory Visit (INDEPENDENT_AMBULATORY_CARE_PROVIDER_SITE_OTHER): Payer: 59 | Admitting: Family Medicine

## 2021-11-23 ENCOUNTER — Other Ambulatory Visit: Payer: Self-pay | Admitting: Family Medicine

## 2021-11-23 VITALS — BP 136/73 | HR 68 | Ht 69.0 in | Wt 204.0 lb

## 2021-11-23 DIAGNOSIS — E782 Mixed hyperlipidemia: Secondary | ICD-10-CM

## 2021-11-23 DIAGNOSIS — I10 Essential (primary) hypertension: Secondary | ICD-10-CM | POA: Diagnosis not present

## 2021-11-23 DIAGNOSIS — R351 Nocturia: Secondary | ICD-10-CM

## 2021-11-23 DIAGNOSIS — N401 Enlarged prostate with lower urinary tract symptoms: Secondary | ICD-10-CM

## 2021-11-23 DIAGNOSIS — R7309 Other abnormal glucose: Secondary | ICD-10-CM

## 2021-11-23 DIAGNOSIS — Z789 Other specified health status: Secondary | ICD-10-CM

## 2021-11-23 DIAGNOSIS — Z0000 Encounter for general adult medical examination without abnormal findings: Secondary | ICD-10-CM | POA: Diagnosis not present

## 2021-11-23 MED ORDER — SILODOSIN 8 MG PO CAPS: 8.0000 mg | ORAL_CAPSULE | Freq: Every day | ORAL | 11 refills | Status: DC

## 2021-11-23 MED ORDER — AMLODIPINE BESYLATE 5 MG PO TABS: 5.0000 mg | ORAL_TABLET | Freq: Every day | ORAL | 11 refills | Status: DC

## 2021-11-23 NOTE — Patient Instructions (Addendum)
Thank you for coming to the office today.  Okay to take baby aspirin and hold off on cholesterol rx  Refilled current meds 30 day to [place] with refills  Continue the generic Silodosin '8mg'$  daily, in future if still bothered by urination, we can refer to Urologist to consider additional medications  Dr Nickolas Madrid, Dr Hollice Espy, Dr John Giovanni  [facility] -1st floor [address] [place],  [place]  [address] Phone: [phone]  DUE for [place] (no food or drink after midnight before the lab appointment, only water or coffee without cream/sugar on the morning of)  SCHEDULE "Lab Only" visit in the morning at the clinic for lab draw in 1 YEAR  - Make sure Lab Only appointment is at about 1 week before your next appointment, so that results will be available  For Lab Results, once available within 2-3 days of blood draw, you can can log in to MyChart online to view your results and a brief explanation. Also, we can discuss results at next follow-up visit.   Please schedule a Follow-up Appointment to: Return in about 1 year (around 11/24/2022) for 1 year fasting lab only then 1 week later Annual Physical.  If you have any other questions or concerns, please feel free to call the office or send a message through [place]. You may also schedule an earlier appointment if necessary.  Additionally, you may be receiving a survey about your experience at our office within a few days to 1 week by e-mail or mail. We value your feedback.  [name], DO [place]

## 2021-11-23 NOTE — Progress Notes (Signed)
Subjective:    Patient ID: Jonathan Ortega, male    DOB: 1961-04-02, 61 y.o.   MRN: [id number]  Jonathan Ortega is a 61 y.o. male presenting on 11/23/2021 for Annual Exam   HPI  Here for Annual Physical and Lab Review.   FOLLOW-UP GERD: Last EGD 01/05/18 done by [facility]. Showed gastrc polyps negative and some mild erythema. He was on H2 Zantac but then recalled, now he remains on PPI Omeprazole 81m daily doing well - Continues on this. Mother's side with maternal uncle of family with esophageal cancer   Pre-Diabetes History - RESOLVED A1c down to 5.4, previous range for past 3 years has been under < 5.7. No A1c in pre diabetic range in several years - Never on medication Lifestyle: - Weight stable some slight gain - Diet: Improving reduce sugar / lower carb healthy diet, rare soda, drinks unsweet tea, water, coffee - Exercise: Walking up to 4 miles daily - No significant family history of DM Past history [facility] report 10/14/20 - background DM retinopathy but no treatment required. Denies hypoglycemia   CHRONIC HTN: Reports no new concerns. BP readings normal Current Meds - Amlodipine 53mdaily   Reports good compliance, took meds today. Tolerating well, w/o complaints.   HYPERLIPIDEMIA: - Reports no concerns. Last lipids 11/2021, with stable to improved LDL but still >100 Not on statin - Taking ASA 8112maily for primary prevention. No known CAD / MI / CVA - Not taking any medicines OTC or rx for cholesterol. Never on statin.   BPH with LUTS, controlled - Reports chronic problem for >6-7 years. Taking generic silodosin 8mg66mily good results, previously discussed he was on brand rapaflo with some good results, due to cost switched, has maintained on this generic now with good results. Still has mild nocturia 1-2 times at night and some slight inc urgency at times otherwise reduced urinary frequency, still has some reduced strength to urinary stream at end.   Vitamin D  deficiency Continues onVitamin D3 1,000 daily       Health Maintenance   - Prostate cancer screening: No known personal or family history, last PSA 0.57 (11/2021), last DRE reported prostate was only mildly enlarged, see above BPH LUTS   - Colonoscopy last done 01/05/18 (Dr TahiBonna Gains) multiple polyp benign repeat 5 years, there was one polyp unable to retreive will need surgical resection, TBD currently scheduling. Next is 2024 - he has family history of esophageal cancer     11/23/2021    9:03 AM 11/17/2020    8:57 AM 11/14/2019    8:52 AM  Depression screen PHQ 2/9  Decreased Interest 0 0 0  Down, Depressed, Hopeless 0 0 0  PHQ - 2 Score 0 0 0  Altered sleeping 0 0   Tired, decreased energy 1 0   Change in appetite 0 0   Feeling bad or failure about yourself  0 0   Trouble concentrating 0 0   Moving slowly or fidgety/restless 0 0   Suicidal thoughts 0 0   PHQ-9 Score 1 0   Difficult doing work/chores Not difficult at all Not difficult at all     Past Medical History:  Diagnosis Date   BPH associated with nocturia    GERD (gastroesophageal reflux disease)    Hemorrhoids    History of anal fissures 1990s   s/p  repair   Hypertension    Hypertrophy, papillae, anal    Mixed hyperlipidemia  OA (osteoarthritis)    Vitamin D deficiency    Wears glasses    Past Surgical History:  Procedure Laterality Date   COLONOSCOPY WITH PROPOFOL N/A 01/05/2018   Procedure: COLONOSCOPY WITH PROPOFOL;  Surgeon: Virgel Manifold, MD;  Location: [facility];  Service: Endoscopy;  Laterality: N/A;   ESOPHAGOGASTRODUODENOSCOPY (EGD) WITH PROPOFOL N/A 01/05/2018   Procedure: ESOPHAGOGASTRODUODENOSCOPY (EGD) WITH PROPOFOL;  Surgeon: Virgel Manifold, MD;  Location: [facility] ENDOSCOPY;  Service: Endoscopy;  Laterality: N/A;   EXCISION OF SKIN TAG N/A 12/28/2018   Procedure: EXCISION OF ANAL [place];  Surgeon: Leighton Ruff, MD;  Location: [facility];  Service: General;   Laterality: N/A;   Social History   Socioeconomic History   Marital status: Single    Spouse name: Not on file   Number of children: Not on file   Years of education: Not on file   Highest education level: Not on file  Occupational History   Not on file  Tobacco Use   Smoking status: Never   Smokeless tobacco: Never  Vaping Use   Vaping Use: Never used  Substance and Sexual Activity   Alcohol use: Yes    Alcohol/week: 0.0 standard drinks of alcohol    Comment: occasional   Drug use: Never   Sexual activity: Not on file  Other Topics Concern   Not on file  Social History Narrative   Not on file   Social Determinants of Health   Financial Resource Strain: Not on file  Food Insecurity: Not on file  Transportation Needs: Not on file  Physical Activity: Not on file  Stress: Not on file  Social Connections: Not on file  Intimate Partner Violence: Not on file   Family History  Problem Relation Age of Onset   Hypertension Paternal Uncle    Hypertension Maternal Grandmother    Ovarian cancer Mother    Esophageal cancer Maternal Uncle    Current Outpatient Medications on File Prior to Visit  Medication Sig   aspirin EC 81 MG tablet Take 81 mg by mouth daily.   cholecalciferol (VITAMIN D) 1000 UNITS tablet Take 1,000 Units by mouth daily.   hydrocortisone (ANUSOL-HC) 25 MG suppository Place 1 suppository (25 mg total) rectally 2 (two) times daily. For 7 days   ibuprofen (ADVIL,MOTRIN) 200 MG tablet Take 200 mg by mouth every 6 (six) hours as needed.   loratadine (CLARITIN) 10 MG tablet Take 10 mg by mouth daily as needed.    Multiple Vitamin (MULTIVITAMIN) tablet Take 1 tablet by mouth daily.   omeprazole (PRILOSEC) 20 MG capsule TAKE 1 CAPSULE BY MOUTH  DAILY BEFORE BREAKFAST   Potassium 99 MG TABS Take by mouth.   psyllium (METAMUCIL) 58.6 % packet Take 1 packet by mouth daily.   No current facility-administered medications on file prior to visit.    Review of  Systems  Constitutional:  Negative for activity change, appetite change, chills, diaphoresis, fatigue and fever.  HENT:  Negative for congestion and hearing loss.   Eyes:  Negative for visual disturbance.  Respiratory:  Negative for cough, chest tightness, shortness of breath and wheezing.   Cardiovascular:  Negative for chest pain, palpitations and leg swelling.  Gastrointestinal:  Negative for abdominal pain, constipation, diarrhea, nausea and vomiting.  Genitourinary:  Negative for dysuria, frequency and hematuria.  Musculoskeletal:  Negative for arthralgias and neck pain.  Skin:  Negative for rash.  Neurological:  Negative for dizziness, weakness, light-headedness, numbness and headaches.  Hematological:  Negative  for adenopathy.  Psychiatric/Behavioral:  Negative for behavioral problems, dysphoric mood and sleep disturbance.    Per HPI unless specifically indicated above      Objective:    BP 136/73   Pulse 68   Ht _0  (1.753 m)   Wt 204 lb (92.5 kg)   SpO2 99%   BMI 30.13 kg/m   Wt Readings from Last 3 Encounters:  11/23/21 204 lb (92.5 kg)  11/17/20 202 lb 9.6 oz (91.9 kg)  11/14/19 200 lb (90.7 kg)    Physical Exam Vitals and nursing note reviewed.  Constitutional:      General: He is not in acute distress.    Appearance: He is well-developed. He is not diaphoretic.     Comments: Well-appearing, comfortable, cooperative  HENT:     Head: Normocephalic and atraumatic.  Eyes:     General:        Right eye: No discharge.        Left eye: No discharge.     Conjunctiva/sclera: Conjunctivae normal.     Pupils: Pupils are equal, round, and reactive to light.  Neck:     Thyroid: No thyromegaly.     Vascular: No carotid bruit.  Cardiovascular:     Rate and Rhythm: Normal rate and regular rhythm.     Pulses: Normal pulses.     Heart sounds: Normal heart sounds. No murmur heard. Pulmonary:     Effort: Pulmonary effort is normal. No respiratory distress.     Breath  sounds: Normal breath sounds. No wheezing or rales.  Abdominal:     General: Bowel sounds are normal. There is no distension.     Palpations: Abdomen is soft. There is no mass.     Tenderness: There is no abdominal tenderness.  Musculoskeletal:        General: No tenderness. Normal range of motion.     Cervical back: Normal range of motion and neck supple.     Comments: Upper / Lower Extremities: - Normal muscle tone, strength bilateral upper extremities 5/5, lower extremities 5/5  Lymphadenopathy:     Cervical: No cervical adenopathy.  Skin:    General: Skin is warm and dry.     Findings: No erythema or rash.  Neurological:     Mental Status: He is alert and oriented to person, place, and time.     Comments: Distal sensation intact to light touch all extremities  Psychiatric:        Mood and Affect: Mood normal.        Behavior: Behavior normal.        Thought Content: Thought content normal.     Comments: Well groomed, good eye contact, normal speech and thoughts    Results for orders placed or performed in visit on 11/15/21  PSA  Result Value Ref Range   PSA 0.57 < OR = 4.00 ng/mL  Hemoglobin A1c  Result Value Ref Range   Hgb A1c MFr Bld 5.4 <5.7 % of total Hgb   Mean Plasma Glucose 108 mg/dL   eAG (mmol/L) 6.0 mmol/L  Lipid panel  Result Value Ref Range   Cholesterol 172 <200 mg/dL   HDL 37 (L) > OR = 40 mg/dL   Triglycerides 116 <150 mg/dL   LDL Cholesterol (Calc) 113 (H) mg/dL (calc)   Total CHOL/HDL Ratio 4.6 <5.0 (calc)   Non-HDL Cholesterol (Calc) 135 (H) <130 mg/dL (calc)  CBC with Differential/Platelet  Result Value Ref Range   WBC 6.2 3.8 -  10.8 Thousand/uL   RBC 4.87 4.20 - 5.80 Million/uL   Hemoglobin 15.5 13.2 - 17.1 g/dL   HCT 46.2 38.5 - 50.0 %   MCV 94.9 80.0 - 100.0 fL   MCH 31.8 27.0 - 33.0 pg   MCHC 33.5 32.0 - 36.0 g/dL   RDW 11.8 11.0 - 15.0 %   Platelets 151 140 - 400 Thousand/uL   MPV 10.5 7.5 - 12.5 fL   Neutro Abs 3,528 1,500 - 7,800  cells/uL   Lymphs Abs 1,662 850 - 3,900 cells/uL   Absolute Monocytes 645 200 - 950 cells/uL   Eosinophils Absolute 316 15 - 500 cells/uL   Basophils Absolute 50 0 - 200 cells/uL   Neutrophils Relative % 56.9 %   Total Lymphocyte 26.8 %   Monocytes Relative 10.4 %   Eosinophils Relative 5.1 %   Basophils Relative 0.8 %  COMPLETE METABOLIC PANEL WITH GFR  Result Value Ref Range   Glucose, Bld 95 65 - 99 mg/dL   BUN 15 7 - 25 mg/dL   Creat 1.18 0.70 - 1.30 mg/dL   eGFR 71 > OR = 60 mL/min/1.53m   BUN/Creatinine Ratio NOT APPLICABLE 6 - 22 (calc)   Sodium 142 135 - 146 mmol/L   Potassium 4.4 3.5 - 5.3 mmol/L   Chloride 106 98 - 110 mmol/L   CO2 28 20 - 32 mmol/L   Calcium 9.0 8.6 - 10.3 mg/dL   Total Protein 7.0 6.1 - 8.1 g/dL   Albumin 4.3 3.6 - 5.1 g/dL   Globulin 2.7 1.9 - 3.7 g/dL (calc)   AG Ratio 1.6 1.0 - 2.5 (calc)   Total Bilirubin 1.4 (H) 0.2 - 1.2 mg/dL   Alkaline phosphatase (APISO) 79 35 - 144 U/L   AST 27 10 - 35 U/L   ALT 40 9 - 46 U/L      Assessment & Plan:   Problem List Items Addressed This Visit     Hyperlipidemia   Relevant Medications   amLODipine (NORVASC) 5 MG tablet   Essential hypertension   Relevant Medications   amLODipine (NORVASC) 5 MG tablet   BPH (benign prostatic hyperplasia)   Relevant Medications   silodosin (RAPAFLO) 8 MG CAPS capsule   Other Visit Diagnoses     Annual physical exam    -  Primary       Updated Health Maintenance information Reviewed recent lab results with patient Encouraged improvement to lifestyle with diet and exercise Goal of weight loss  Resolved Pre Diabetes diagnosis. Now , 5.7 range A1c consistently >1 year+  HTN Controlled, re order Amlodipine 5  Okay to take baby aspirin and hold off on cholesterol rx  Refilled current meds 30 day to [facility]ills  Continue the generic Silodosin 812mdaily, in future if still bothered by urination, we can refer to Urologist to consider additional  medications  Dr BrNickolas MadridDr AsHollice EspyDr ScJohn GiovanniBuC[facility]st [facility] [address] [place]  [phone]: [phone] Meds ordered this encounter  Medications   amLODipine (NORVASC) 5 MG tablet    Sig: Take 1 tablet (5 mg total) by mouth daily.    Dispense:  30 tablet    Refill:  11   silodosin (RAPAFLO) 8 MG CAPS capsule    Sig: Take 1 capsule (8 mg total) by mouth daily with breakfast.    Dispense:  30 capsule    Refill:  11    Follow up plan: Return in about 1 year (around 11/24/2022) for 1 year fasting lab only then 1 week later Annual Physical.  Future labs 11/23/22 Add shingles zoster antibody   [name], DO [facility] 11/23/2021, 9:09 AM

## 2021-12-06 ENCOUNTER — Other Ambulatory Visit: Payer: Self-pay | Admitting: Family Medicine

## 2021-12-06 DIAGNOSIS — K219 Gastro-esophageal reflux disease without esophagitis: Secondary | ICD-10-CM

## 2021-12-08 NOTE — Telephone Encounter (Signed)
Requested Prescriptions  Pending Prescriptions Disp Refills  . omeprazole (PRILOSEC) 20 MG capsule [Pharmacy Med Name: Omeprazole Oral Capsule Delayed Release 20 MG] 90 capsule 3    Sig: TAKE ONE CAPSULE BY MOUTH ONE TIME DAILY before breakfast     Gastroenterology: Proton Pump Inhibitors Passed - 12/06/2021 10:23 AM      Passed - Valid encounter within last 12 months    Recent Outpatient Visits          2 weeks ago Annual physical exam   [facility] [name], DO   1 year ago Annual physical exam   [facility] [name], DO   2 years ago Annual physical exam   [facility] [name], DO   3 years ago Annual physical exam   [facility] [name], DO   3 years ago Essential hypertension   [employer], DO      Future Appointments            In 11 months [facility], Devonne Doughty, [facility], [facility]

## 2022-06-06 DIAGNOSIS — Z419 Encounter for procedure for purposes other than remedying health state, unspecified: Secondary | ICD-10-CM | POA: Diagnosis not present

## 2022-07-07 DIAGNOSIS — Z419 Encounter for procedure for purposes other than remedying health state, unspecified: Secondary | ICD-10-CM | POA: Diagnosis not present

## 2022-08-05 DIAGNOSIS — Z419 Encounter for procedure for purposes other than remedying health state, unspecified: Secondary | ICD-10-CM | POA: Diagnosis not present

## 2022-09-05 DIAGNOSIS — Z419 Encounter for procedure for purposes other than remedying health state, unspecified: Secondary | ICD-10-CM | POA: Diagnosis not present

## 2022-10-05 DIAGNOSIS — Z419 Encounter for procedure for purposes other than remedying health state, unspecified: Secondary | ICD-10-CM | POA: Diagnosis not present

## 2022-11-05 DIAGNOSIS — Z419 Encounter for procedure for purposes other than remedying health state, unspecified: Secondary | ICD-10-CM | POA: Diagnosis not present

## 2022-11-23 ENCOUNTER — Other Ambulatory Visit: Payer: 59

## 2022-11-23 DIAGNOSIS — R7309 Other abnormal glucose: Secondary | ICD-10-CM

## 2022-11-23 DIAGNOSIS — E782 Mixed hyperlipidemia: Secondary | ICD-10-CM | POA: Diagnosis not present

## 2022-11-23 DIAGNOSIS — Z0000 Encounter for general adult medical examination without abnormal findings: Secondary | ICD-10-CM

## 2022-11-23 DIAGNOSIS — Z789 Other specified health status: Secondary | ICD-10-CM | POA: Diagnosis not present

## 2022-11-23 DIAGNOSIS — N401 Enlarged prostate with lower urinary tract symptoms: Secondary | ICD-10-CM | POA: Diagnosis not present

## 2022-11-23 DIAGNOSIS — R351 Nocturia: Secondary | ICD-10-CM | POA: Diagnosis not present

## 2022-11-23 DIAGNOSIS — I10 Essential (primary) hypertension: Secondary | ICD-10-CM | POA: Diagnosis not present

## 2022-11-24 LAB — HEMOGLOBIN A1C
Hgb A1c MFr Bld: 5.8 % of total Hgb — ABNORMAL HIGH (ref <5.7)
Mean Plasma Glucose: 120 mg/dL
eAG (mmol/L): 6.6 mmol/L

## 2022-11-24 LAB — CBC WITH DIFFERENTIAL/PLATELET
Absolute Monocytes: 710 cells/uL (ref 200–950)
Basophils Absolute: 57 cells/uL (ref 0–200)
Basophils Relative: 0.8 %
Eosinophils Absolute: 398 cells/uL (ref 15–500)
Eosinophils Relative: 5.6 %
HCT: 46.7 % (ref 38.5–50.0)
Hemoglobin: 15.6 g/dL (ref 13.2–17.1)
Lymphs Abs: 1803 cells/uL (ref 850–3900)
MCH: 31.9 pg (ref 27.0–33.0)
MCHC: 33.4 g/dL (ref 32.0–36.0)
MCV: 95.5 fL (ref 80.0–100.0)
MPV: 10.6 fL (ref 7.5–12.5)
Monocytes Relative: 10 %
Neutro Abs: 4132 cells/uL (ref 1500–7800)
Neutrophils Relative %: 58.2 %
Platelets: 172 10*3/uL (ref 140–400)
RBC: 4.89 10*6/uL (ref 4.20–5.80)
RDW: 11.8 % (ref 11.0–15.0)
Total Lymphocyte: 25.4 %
WBC: 7.1 10*3/uL (ref 3.8–10.8)

## 2022-11-24 LAB — COMPLETE METABOLIC PANEL WITH GFR
AG Ratio: 1.6 (calc) (ref 1.0–2.5)
ALT: 38 U/L (ref 9–46)
AST: 26 U/L (ref 10–35)
Albumin: 4.2 g/dL (ref 3.6–5.1)
Alkaline phosphatase (APISO): 80 U/L (ref 35–144)
BUN: 15 mg/dL (ref 7–25)
CO2: 29 mmol/L (ref 20–32)
Calcium: 8.9 mg/dL (ref 8.6–10.3)
Chloride: 104 mmol/L (ref 98–110)
Creat: 1.16 mg/dL (ref 0.70–1.35)
Globulin: 2.6 g/dL (calc) (ref 1.9–3.7)
Glucose, Bld: 107 mg/dL — ABNORMAL HIGH (ref 65–99)
Potassium: 3.8 mmol/L (ref 3.5–5.3)
Sodium: 141 mmol/L (ref 135–146)
Total Bilirubin: 1.1 mg/dL (ref 0.2–1.2)
Total Protein: 6.8 g/dL (ref 6.1–8.1)
eGFR: 72 mL/min/{1.73_m2} (ref >=60)

## 2022-11-24 LAB — LIPID PANEL
Cholesterol: 178 mg/dL (ref <200)
HDL: 42 mg/dL (ref >=40)
LDL Cholesterol (Calc): 108 mg/dL (calc) — ABNORMAL HIGH
Non-HDL Cholesterol (Calc): 136 mg/dL (calc) — ABNORMAL HIGH (ref <130)
Total CHOL/HDL Ratio: 4.2 (calc) (ref <5.0)
Triglycerides: 160 mg/dL — ABNORMAL HIGH (ref <150)

## 2022-11-24 LAB — VARICELLA ZOSTER ANTIBODY, IGG: Varicella IgG: 2109 index

## 2022-11-24 LAB — PSA: PSA: 0.6 ng/mL (ref <=4.00)

## 2022-11-30 ENCOUNTER — Encounter: Payer: Self-pay | Admitting: Family Medicine

## 2022-11-30 ENCOUNTER — Ambulatory Visit (INDEPENDENT_AMBULATORY_CARE_PROVIDER_SITE_OTHER): Payer: 59 | Admitting: Family Medicine

## 2022-11-30 VITALS — BP 122/68 | HR 76 | Temp 98.1°F | Resp 18 | Ht 69.0 in | Wt 204.0 lb

## 2022-11-30 DIAGNOSIS — K219 Gastro-esophageal reflux disease without esophagitis: Secondary | ICD-10-CM | POA: Diagnosis not present

## 2022-11-30 DIAGNOSIS — R351 Nocturia: Secondary | ICD-10-CM | POA: Diagnosis not present

## 2022-11-30 DIAGNOSIS — I10 Essential (primary) hypertension: Secondary | ICD-10-CM | POA: Diagnosis not present

## 2022-11-30 DIAGNOSIS — Z0000 Encounter for general adult medical examination without abnormal findings: Secondary | ICD-10-CM

## 2022-11-30 DIAGNOSIS — N401 Enlarged prostate with lower urinary tract symptoms: Secondary | ICD-10-CM

## 2022-11-30 DIAGNOSIS — R7309 Other abnormal glucose: Secondary | ICD-10-CM

## 2022-11-30 MED ORDER — AMLODIPINE BESYLATE 5 MG PO TABS: 5.0000 mg | ORAL_TABLET | Freq: Every day | ORAL | 11 refills | Status: DC

## 2022-11-30 MED ORDER — OMEPRAZOLE 20 MG PO CPDR: DELAYED_RELEASE_CAPSULE | ORAL | 12 refills | Status: DC

## 2022-11-30 MED ORDER — SILODOSIN 8 MG PO CAPS: 8.0000 mg | ORAL_CAPSULE | Freq: Every day | ORAL | 11 refills | Status: DC

## 2022-11-30 NOTE — Progress Notes (Unsigned)
Subjective:    Patient ID: Jonathan Ortega, male    DOB: 22-Jun-1960, 62 y.o.   MRN: [id number]  Jonathan Ortega is a 62 y.o. male presenting on 11/30/2022 for Annual Exam   HPI  Here for Annual Physical and Lab Review.   FOLLOW-UP GERD: Last EGD 01/05/18 done by [facility]. Showed gastrc polyps negative and some mild erythema. He was on H2 Zantac but then recalled, now he remains on PPI Omeprazole 20mg  daily doing well - Continues on this. Mother's side with maternal uncle of family with esophageal cancer   Pre-Diabetes A1c 5.8 Prior range 5.4 to 5.5 - Never on medication Lifestyle: - Weight stable - Diet: Improving reduce sugar / lower carb healthy diet, rare soda, drinks unsweet tea, water, coffee - Exercise: Walking up to 4 miles daily - No significant family history of DM Past history [facility] report 10/14/20 - background DM retinopathy but no treatment required. Denies hypoglycemia   CHRONIC HTN: Reports no new concerns. BP readings normal Current Meds - Amlodipine 5mg  daily   Reports good compliance, took meds today. Tolerating well, w/o complaints.   HYPERLIPIDEMIA: - Reports no concerns. Last lipids 11/2022, LDL 108, improved from 110s prior. Not on statin. Considering - Taking ASA 81mg  daily for primary prevention. No known CAD / MI / CVA - Not taking any medicines OTC or rx for cholesterol. Never on statin.   BPH with LUTS, controlled - Reports chronic problem for >6-7 years. Taking generic silodosin 8mg  daily good results, previously discussed he was on brand rapaflo with some good results, due to cost switched, has maintained on this generic now with good results. Still has mild nocturia 1-2 times at night and some slight inc urgency at times otherwise reduced urinary frequency, still has some reduced strength to urinary stream at end. Controlled mostly on Silodosin, due for refill   Vitamin D deficiency Continues onVitamin D3 1,000 daily      Health Maintenance    - Prostate cancer screening: No known personal or family history, last PSA 0.6 (11/2022), and (prior 0.57 (11/2021), last DRE reported prostate was only mildly enlarged, see above BPH LUTS   - Colonoscopy last done 01/05/18 (Dr Maximino Greenland [facility]) multiple polyp benign repeat 5 years, there was one polyp unable to retreive will need surgical resection, TBD currently scheduling. Next is 2024 - he has family history of esophageal cancer He will defer Colonoscopy for now, and contact us or GI when ready within 1 year  Varicella IgG 2109, proves history of Chicken Pox, eligible for Shingrix but decline today.       11/30/2022    9:00 AM 11/23/2021    9:03 AM 11/17/2020    8:57 AM  Depression screen PHQ 2/9  Decreased Interest 0 0 0  Down, Depressed, Hopeless 0 0 0  PHQ - 2 Score 0 0 0  Altered sleeping 0 0 0  Tired, decreased energy 0 1 0  Change in appetite 0 0 0  Feeling bad or failure about yourself  0 0 0  Trouble concentrating 0 0 0  Moving slowly or fidgety/restless 0 0 0  Suicidal thoughts 0 0 0  PHQ-9 Score 0 1 0  Difficult doing work/chores Not difficult at all Not difficult at all Not difficult at all    Past Medical History:  Diagnosis Date   BPH associated with nocturia    GERD (gastroesophageal reflux disease)    Hemorrhoids    History of anal fissures 1990s  s/p  repair   Hypertension    Hypertrophy, papillae, anal    Mixed hyperlipidemia    OA (osteoarthritis)    Vitamin D deficiency    Wears glasses    Past Surgical History:  Procedure Laterality Date   COLONOSCOPY WITH PROPOFOL N/A 01/05/2018   Procedure: COLONOSCOPY WITH PROPOFOL;  Surgeon: Pasty Spillers, MD;  Location: [facility];  Service: Endoscopy;  Laterality: N/A;   ESOPHAGOGASTRODUODENOSCOPY (EGD) WITH PROPOFOL N/A 01/05/2018   Procedure: ESOPHAGOGASTRODUODENOSCOPY (EGD) WITH PROPOFOL;  Surgeon: Pasty Spillers, MD;  Location: [facility];  Service: Endoscopy;  Laterality: N/A;   EXCISION  OF SKIN TAG N/A 12/28/2018   Procedure: EXCISION OF ANAL PAPPILLA;  Surgeon: Romie Levee, MD;  Location: [facility];  Service: General;  Laterality: N/A;   Social History   Socioeconomic History   Marital status: Single    Spouse name: Not on file   Number of children: Not on file   Years of education: Not on file   Highest education level: Not on file  Occupational History   Not on file  Tobacco Use   Smoking status: Never   Smokeless tobacco: Never  Vaping Use   Vaping Use: Never used  Substance and Sexual Activity   Alcohol use: Yes    Alcohol/week: 0.0 standard drinks of alcohol    Comment: occasional   Drug use: Never   Sexual activity: Not on file  Other Topics Concern   Not on file  Social History Narrative   Not on file   Social Determinants of Health   Financial Resource Strain: Not on file  Food Insecurity: Not on file  Transportation Needs: Not on file  Physical Activity: Not on file  Stress: Not on file  Social Connections: Not on file  Intimate Partner Violence: Not on file   Family History  Problem Relation Age of Onset   Hypertension Paternal Uncle    Hypertension Maternal Grandmother    Ovarian cancer Mother    Esophageal cancer Maternal Uncle    Current Outpatient Medications on File Prior to Visit  Medication Sig   aspirin EC 81 MG tablet Take 81 mg by mouth daily.   cholecalciferol (VITAMIN D) 1000 UNITS tablet Take 1,000 Units by mouth daily.   loratadine (CLARITIN) 10 MG tablet Take 10 mg by mouth daily as needed.    Multiple Vitamin (MULTIVITAMIN) tablet Take 1 tablet by mouth daily.   Potassium 99 MG TABS Take by mouth.   psyllium (METAMUCIL) 58.6 % packet Take 1 packet by mouth daily.   hydrocortisone (ANUSOL-HC) 25 MG suppository Place 1 suppository (25 mg total) rectally 2 (two) times daily. For 7 days (Patient not taking: Reported on 11/30/2022)   ibuprofen (ADVIL,MOTRIN) 200 MG tablet Take 200 mg by mouth every 6  (six) hours as needed. (Patient not taking: Reported on 11/30/2022)   No current facility-administered medications on file prior to visit.    Review of Systems  Constitutional:  Negative for activity change, appetite change, chills, diaphoresis, fatigue and fever.  HENT:  Negative for congestion and hearing loss.   Eyes:  Negative for visual disturbance.  Respiratory:  Negative for cough, chest tightness, shortness of breath and wheezing.   Cardiovascular:  Negative for chest pain, palpitations and leg swelling.  Gastrointestinal:  Negative for abdominal pain, constipation, diarrhea, nausea and vomiting.  Genitourinary:  Negative for dysuria, frequency and hematuria.       Nocturia  Musculoskeletal:  Negative for arthralgias and  neck pain.  Skin:  Negative for rash.  Neurological:  Negative for dizziness, weakness, light-headedness, numbness and headaches.  Hematological:  Negative for adenopathy.  Psychiatric/Behavioral:  Negative for behavioral problems, dysphoric mood and sleep disturbance.    Per HPI unless specifically indicated above      Objective:    BP 122/68 (BP Location: Right Arm, Patient Position: Sitting, Cuff Size: Large)   Pulse 76   Temp 98.1 F (36.7 C) (Oral)   Resp 18   Ht 5\' 9"  (1.753 m)   Wt 204 lb (92.5 kg)   SpO2 98%   BMI 30.13 kg/m   Wt Readings from Last 3 Encounters:  11/30/22 204 lb (92.5 kg)  11/23/21 204 lb (92.5 kg)  11/17/20 202 lb 9.6 oz (91.9 kg)    Physical Exam Vitals and nursing note reviewed.  Constitutional:      General: He is not in acute distress.    Appearance: He is well-developed. He is obese. He is not diaphoretic.     Comments: Well-appearing, comfortable, cooperative  HENT:     Head: Normocephalic and atraumatic.  Eyes:     General:        Right eye: No discharge.        Left eye: No discharge.     Conjunctiva/sclera: Conjunctivae normal.     Pupils: Pupils are equal, round, and reactive to light.  Neck:      Thyroid: No thyromegaly.     Vascular: No carotid bruit.  Cardiovascular:     Rate and Rhythm: Normal rate and regular rhythm.     Pulses: Normal pulses.     Heart sounds: Normal heart sounds. No murmur heard. Pulmonary:     Effort: Pulmonary effort is normal. No respiratory distress.     Breath sounds: Normal breath sounds. No wheezing or rales.  Abdominal:     General: Bowel sounds are normal. There is no distension.     Palpations: Abdomen is soft. There is no mass.     Tenderness: There is no abdominal tenderness.  Musculoskeletal:        General: No tenderness. Normal range of motion.     Cervical back: Normal range of motion and neck supple.     Right lower leg: No edema.     Left lower leg: No edema.     Comments: Upper / Lower Extremities: - Normal muscle tone, strength bilateral upper extremities 5/5, lower extremities 5/5  Lymphadenopathy:     Cervical: No cervical adenopathy.  Skin:    General: Skin is warm and dry.     Findings: No erythema or rash.  Neurological:     Mental Status: He is alert and oriented to person, place, and time.     Comments: Distal sensation intact to light touch all extremities  Psychiatric:        Mood and Affect: Mood normal.        Behavior: Behavior normal.        Thought Content: Thought content normal.     Comments: Well groomed, good eye contact, normal speech and thoughts       Results for orders placed or performed in visit on 11/23/22  PSA  Result Value Ref Range   PSA 0.60 < OR = 4.00 ng/mL  Hemoglobin A1c  Result Value Ref Range   Hgb A1c MFr Bld 5.8 (H) <5.7 % of total Hgb   Mean Plasma Glucose 120 mg/dL   eAG (mmol/L) 6.6 mmol/L  Lipid  panel  Result Value Ref Range   Cholesterol 178 <200 mg/dL   HDL 42 > OR = 40 mg/dL   Triglycerides 962 (H) <150 mg/dL   LDL Cholesterol (Calc) 108 (H) mg/dL (calc)   Total CHOL/HDL Ratio 4.2 <5.0 (calc)   Non-HDL Cholesterol (Calc) 136 (H) <130 mg/dL (calc)  CBC with  Differential/Platelet  Result Value Ref Range   WBC 7.1 3.8 - 10.8 Thousand/uL   RBC 4.89 4.20 - 5.80 Million/uL   Hemoglobin 15.6 13.2 - 17.1 g/dL   HCT 95.2 84.1 - 32.4 %   MCV 95.5 80.0 - 100.0 fL   MCH 31.9 27.0 - 33.0 pg   MCHC 33.4 32.0 - 36.0 g/dL   RDW 40.1 02.7 - 25.3 %   Platelets 172 140 - 400 Thousand/uL   MPV 10.6 7.5 - 12.5 fL   Neutro Abs 4,132 1,500 - 7,800 cells/uL   Lymphs Abs 1,803 850 - 3,900 cells/uL   Absolute Monocytes 710 200 - 950 cells/uL   Eosinophils Absolute 398 15 - 500 cells/uL   Basophils Absolute 57 0 - 200 cells/uL   Neutrophils Relative % 58.2 %   Total Lymphocyte 25.4 %   Monocytes Relative 10.0 %   Eosinophils Relative 5.6 %   Basophils Relative 0.8 %  COMPLETE METABOLIC PANEL WITH GFR  Result Value Ref Range   Glucose, Bld 107 (H) 65 - 99 mg/dL   BUN 15 7 - 25 mg/dL   Creat 6.64 4.03 - 4.74 mg/dL   eGFR 72 > OR = 60 QV/ZDG/3.87F6   BUN/Creatinine Ratio SEE NOTE: 6 - 22 (calc)   Sodium 141 135 - 146 mmol/L   Potassium 3.8 3.5 - 5.3 mmol/L   Chloride 104 98 - 110 mmol/L   CO2 29 20 - 32 mmol/L   Calcium 8.9 8.6 - 10.3 mg/dL   Total Protein 6.8 6.1 - 8.1 g/dL   Albumin 4.2 3.6 - 5.1 g/dL   Globulin 2.6 1.9 - 3.7 g/dL (calc)   AG Ratio 1.6 1.0 - 2.5 (calc)   Total Bilirubin 1.1 0.2 - 1.2 mg/dL   Alkaline phosphatase (APISO) 80 35 - 144 U/L   AST 26 10 - 35 U/L   ALT 38 9 - 46 U/L  Varicella zoster antibody, IgG  Result Value Ref Range   Varicella IgG 2,109.00 index      Assessment & Plan:   Problem List Items Addressed This Visit     BPH (benign prostatic hyperplasia)    Stable chronic BPH with mostly controlled has some weaker urinary stream at time. PSA stable - Last DRE 2017, reported normal - No known personal/family history of prostate CA  Plan: 1. Continue generic Silodosin 8mg  daily - refill  Follow-up as needed, future consider alternative Urology referral      Relevant Medications   silodosin (RAPAFLO) 8 MG  CAPS capsule   Elevated hemoglobin A1c    Elevated A1c to 5.8 now Concern with obesity, HTN, HLD  Plan:  1. Not on any therapy currently  2. Encourage improved lifestyle - low carb, low sugar diet, reduce portion size, continue improving regular exercise       Essential hypertension    Controlled HTN - Home BP readings normal No known complications    Plan:  1. Continue current BP regimen Amlodipine 5mg  daily 2. Encourage improved lifestyle - low sodium diet, regular exercise 3. Continue monitor BP outside office, bring readings to next visit, if persistently >140/90 or new symptoms  notify office sooner      Relevant Medications   amLODipine (NORVASC) 5 MG tablet   GERD (gastroesophageal reflux disease)    Stable chronic GERD without complication. - No GI red flag symptoms - Chronic PPI use, low dose - S/p last EGD updated 01/2018 per [facility], see report Fam history of esophageal cancer OFF Zantac recall  Plan: 1. Continue Omeprazole 20mg  daily - re order 2. Diet modifications reduce GERD      Relevant Medications   omeprazole (PRILOSEC) 20 MG capsule   Other Visit Diagnoses     Annual physical exam    -  Primary       Updated Health Maintenance information Reviewed recent lab results with patient Encouraged improvement to lifestyle with diet and exercise Goal of weight loss  Due for Colonoscopy 01/2023, request referral when ready. Gave him contact info for [facility]    Meds ordered this encounter  Medications   amLODipine (NORVASC) 5 MG tablet    Sig: Take 1 tablet (5 mg total) by mouth daily.    Dispense:  30 tablet    Refill:  11   silodosin (RAPAFLO) 8 MG CAPS capsule    Sig: Take 1 capsule (8 mg total) by mouth daily with breakfast.    Dispense:  30 capsule    Refill:  11   omeprazole (PRILOSEC) 20 MG capsule    Sig: TAKE ONE CAPSULE BY MOUTH ONE TIME DAILY before breakfast    Dispense:  30 capsule    Refill:  12      Follow up plan: Return in  about 1 year (around 11/30/2023) for 1 year fasting lab only then 1 week later Annual Physical.  Future labs 11/2023  Saralyn Pilar, DO [facility] 11/30/2022, 9:20 AM

## 2022-11-30 NOTE — Patient Instructions (Addendum)
Thank you for coming to the office today.  Recent Labs    11/23/22 0756  HGBA1C 5.8*   Goal to keep improving diet exercise Look for any possible higher carb intake, and limit this and portions Keep up the good work.  Let me know if considering. Statin. - Recommend currently - Rosuvastatin (Crestor), Atorvastatin (Lipitor), Pravastatin (Pravachol)  3 month lab after.  Refilled medicines to [facility]  Next due Colonoscopy now 5 years later, will be August 2024 or later. Let me know and I can refer.  [facility]) [address] - [address] [place], [place] [address] Phone: [phone]  DUE for FASTING BLOOD WORK (no food or drink after midnight before the lab appointment, only water or coffee without cream/sugar on the morning of)  SCHEDULE "Lab Only" visit in the morning at the clinic for lab draw in 6 MONTHS   - Make sure Lab Only appointment is at about 1 week before your next appointment, so that results will be available  For Lab Results, once available within 2-3 days of blood draw, you can can log in to MyChart online to view your results and a brief explanation. Also, we can discuss results at next follow-up visit.   Please schedule a Follow-up Appointment to: Return in about 1 year (around 11/30/2023) for 1 year fasting lab only then 1 week later Annual Physical.  If you have any other questions or concerns, please feel free to call the office or send a message through MyChart. You may also schedule an earlier appointment if necessary.  Additionally, you may be receiving a survey about your experience at our office within a few days to 1 week by e-mail or mail. We value your feedback.  Saralyn Pilar, DO [facility], [place]

## 2022-12-01 ENCOUNTER — Other Ambulatory Visit: Payer: Self-pay | Admitting: Family Medicine

## 2022-12-01 DIAGNOSIS — E782 Mixed hyperlipidemia: Secondary | ICD-10-CM

## 2022-12-01 DIAGNOSIS — N401 Enlarged prostate with lower urinary tract symptoms: Secondary | ICD-10-CM

## 2022-12-01 DIAGNOSIS — R7309 Other abnormal glucose: Secondary | ICD-10-CM | POA: Insufficient documentation

## 2022-12-01 DIAGNOSIS — I10 Essential (primary) hypertension: Secondary | ICD-10-CM

## 2022-12-01 DIAGNOSIS — Z0000 Encounter for general adult medical examination without abnormal findings: Secondary | ICD-10-CM

## 2022-12-01 NOTE — Assessment & Plan Note (Signed)
Elevated A1c to 5.8 now Concern with obesity, HTN, HLD  Plan:  1. Not on any therapy currently  2. Encourage improved lifestyle - low carb, low sugar diet, reduce portion size, continue improving regular exercise

## 2022-12-01 NOTE — Assessment & Plan Note (Signed)
Controlled HTN - Home BP readings normal No known complications    Plan:  1. Continue current BP regimen Amlodipine 5mg daily 2. Encourage improved lifestyle - low sodium diet, regular exercise 3. Continue monitor BP outside office, bring readings to next visit, if persistently >140/90 or new symptoms notify office sooner 

## 2022-12-01 NOTE — Assessment & Plan Note (Signed)
Stable chronic GERD without complication. - No GI red flag symptoms - Chronic PPI use, low dose - S/p last EGD updated 01/2018 per [facility], see report Fam history of esophageal cancer OFF Zantac recall  Plan: 1. Continue Omeprazole 20mg daily - re order 2. Diet modifications reduce GERD 

## 2022-12-01 NOTE — Assessment & Plan Note (Signed)
Stable chronic BPH with mostly controlled has some weaker urinary stream at time. PSA stable - Last DRE 2017, reported normal - No known personal/family history of prostate CA  Plan: 1. Continue generic Silodosin 8mg  daily - refill  Follow-up as needed, future consider alternative Urology referral

## 2022-12-05 DIAGNOSIS — Z419 Encounter for procedure for purposes other than remedying health state, unspecified: Secondary | ICD-10-CM | POA: Diagnosis not present

## 2023-01-05 DIAGNOSIS — Z419 Encounter for procedure for purposes other than remedying health state, unspecified: Secondary | ICD-10-CM | POA: Diagnosis not present

## 2023-02-05 DIAGNOSIS — Z419 Encounter for procedure for purposes other than remedying health state, unspecified: Secondary | ICD-10-CM | POA: Diagnosis not present

## 2023-11-24 ENCOUNTER — Other Ambulatory Visit: Payer: Self-pay

## 2023-11-24 DIAGNOSIS — E782 Mixed hyperlipidemia: Secondary | ICD-10-CM

## 2023-11-24 DIAGNOSIS — R7309 Other abnormal glucose: Secondary | ICD-10-CM

## 2023-11-24 DIAGNOSIS — Z0000 Encounter for general adult medical examination without abnormal findings: Secondary | ICD-10-CM | POA: Diagnosis not present

## 2023-11-24 DIAGNOSIS — N401 Enlarged prostate with lower urinary tract symptoms: Secondary | ICD-10-CM | POA: Diagnosis not present

## 2023-11-24 DIAGNOSIS — R351 Nocturia: Secondary | ICD-10-CM | POA: Diagnosis not present

## 2023-11-24 DIAGNOSIS — I10 Essential (primary) hypertension: Secondary | ICD-10-CM

## 2023-11-25 LAB — CBC WITH DIFFERENTIAL/PLATELET
Absolute Lymphocytes: 1532 {cells}/uL (ref 850–3900)
Absolute Monocytes: 731 {cells}/uL (ref 200–950)
Basophils Absolute: 69 {cells}/uL (ref 0–200)
Basophils Relative: 1 %
Eosinophils Absolute: 297 {cells}/uL (ref 15–500)
Eosinophils Relative: 4.3 %
HCT: 46.6 % (ref 38.5–50.0)
Hemoglobin: 15.1 g/dL (ref 13.2–17.1)
MCH: 31.4 pg (ref 27.0–33.0)
MCHC: 32.4 g/dL (ref 32.0–36.0)
MCV: 96.9 fL (ref 80.0–100.0)
MPV: 10.9 fL (ref 7.5–12.5)
Monocytes Relative: 10.6 %
Neutro Abs: 4271 {cells}/uL (ref 1500–7800)
Neutrophils Relative %: 61.9 %
Platelets: 167 10*3/uL (ref 140–400)
RBC: 4.81 10*6/uL (ref 4.20–5.80)
RDW: 12.1 % (ref 11.0–15.0)
Total Lymphocyte: 22.2 %
WBC: 6.9 10*3/uL (ref 3.8–10.8)

## 2023-11-25 LAB — HEMOGLOBIN A1C
Hgb A1c MFr Bld: 5.7 % — ABNORMAL HIGH (ref <5.7)
Mean Plasma Glucose: 117 mg/dL
eAG (mmol/L): 6.5 mmol/L

## 2023-11-25 LAB — LIPID PANEL
Cholesterol: 173 mg/dL (ref <200)
HDL: 35 mg/dL — ABNORMAL LOW (ref >=40)
LDL Cholesterol (Calc): 113 mg/dL — ABNORMAL HIGH
Non-HDL Cholesterol (Calc): 138 mg/dL — ABNORMAL HIGH (ref <130)
Total CHOL/HDL Ratio: 4.9 (calc) (ref <5.0)
Triglycerides: 133 mg/dL (ref <150)

## 2023-11-25 LAB — COMPLETE METABOLIC PANEL WITHOUT GFR
AG Ratio: 2 (calc) (ref 1.0–2.5)
ALT: 22 U/L (ref 9–46)
AST: 22 U/L (ref 10–35)
Albumin: 4.5 g/dL (ref 3.6–5.1)
Alkaline phosphatase (APISO): 70 U/L (ref 35–144)
BUN: 12 mg/dL (ref 7–25)
CO2: 27 mmol/L (ref 20–32)
Calcium: 9.2 mg/dL (ref 8.6–10.3)
Chloride: 105 mmol/L (ref 98–110)
Creat: 1.18 mg/dL (ref 0.70–1.35)
Globulin: 2.3 g/dL (ref 1.9–3.7)
Glucose, Bld: 102 mg/dL — ABNORMAL HIGH (ref 65–99)
Potassium: 4.2 mmol/L (ref 3.5–5.3)
Sodium: 141 mmol/L (ref 135–146)
Total Bilirubin: 1.3 mg/dL — ABNORMAL HIGH (ref 0.2–1.2)
Total Protein: 6.8 g/dL (ref 6.1–8.1)

## 2023-11-25 LAB — TSH: TSH: 2.77 m[IU]/L (ref 0.40–4.50)

## 2023-11-25 LAB — PSA: PSA: 1.12 ng/mL (ref <=4.00)

## 2023-11-30 ENCOUNTER — Ambulatory Visit (INDEPENDENT_AMBULATORY_CARE_PROVIDER_SITE_OTHER): Payer: Self-pay | Admitting: Family Medicine

## 2023-11-30 ENCOUNTER — Encounter: Payer: Self-pay | Admitting: Family Medicine

## 2023-11-30 VITALS — BP 118/70 | HR 74 | Ht 69.0 in | Wt 194.5 lb

## 2023-11-30 DIAGNOSIS — N401 Enlarged prostate with lower urinary tract symptoms: Secondary | ICD-10-CM | POA: Diagnosis not present

## 2023-11-30 DIAGNOSIS — E782 Mixed hyperlipidemia: Secondary | ICD-10-CM

## 2023-11-30 DIAGNOSIS — R351 Nocturia: Secondary | ICD-10-CM

## 2023-11-30 DIAGNOSIS — K219 Gastro-esophageal reflux disease without esophagitis: Secondary | ICD-10-CM | POA: Diagnosis not present

## 2023-11-30 DIAGNOSIS — L301 Dyshidrosis [pompholyx]: Secondary | ICD-10-CM | POA: Diagnosis not present

## 2023-11-30 DIAGNOSIS — R7309 Other abnormal glucose: Secondary | ICD-10-CM | POA: Diagnosis not present

## 2023-11-30 DIAGNOSIS — Z1211 Encounter for screening for malignant neoplasm of colon: Secondary | ICD-10-CM

## 2023-11-30 DIAGNOSIS — Z0000 Encounter for general adult medical examination without abnormal findings: Secondary | ICD-10-CM | POA: Diagnosis not present

## 2023-11-30 DIAGNOSIS — I10 Essential (primary) hypertension: Secondary | ICD-10-CM | POA: Diagnosis not present

## 2023-11-30 DIAGNOSIS — K635 Polyp of colon: Secondary | ICD-10-CM

## 2023-11-30 MED ORDER — OMEPRAZOLE 20 MG PO CPDR: 20.0000 mg | DELAYED_RELEASE_CAPSULE | Freq: Every day | ORAL | Status: AC

## 2023-11-30 MED ORDER — AMLODIPINE BESYLATE 5 MG PO TABS
5.0000 mg | ORAL_TABLET | Freq: Every day | ORAL | 11 refills | Status: AC
Start: 2023-11-30 — End: ?

## 2023-11-30 MED ORDER — SILODOSIN 8 MG PO CAPS
8.0000 mg | ORAL_CAPSULE | Freq: Every day | ORAL | 11 refills | Status: AC
Start: 2023-11-30 — End: ?

## 2023-11-30 MED ORDER — CLOBETASOL PROPIONATE 0.05 % EX CREA: 1.0000 | TOPICAL_CREAM | Freq: Two times a day (BID) | CUTANEOUS | 0 refills | Status: AC

## 2023-11-30 NOTE — Patient Instructions (Addendum)
 Thank you for coming to the office today.  Referral to GI at [facility] - previous Colonoscopy 2019, due for repeat due to polyps.  [facility] [address] [place], [place] [address] Hours: 8AM-5PM Phone: [phone]  Call them if you don't hear back on status on the Colonoscopy scheduling.  ---------------------------------------------  You have been referred for a Coronary Calcium Score Cardiac CT Scan. This is a screening test for patients aged 63-50+ with cardiovascular risk factors or who are healthy but would be interested in Cardiovascular Screening for heart disease. Even if there is a family history of heart disease, this imaging can be useful. Typically it can be done every 5+ years or at a different timeline we agree on  The scan will look at the chest and mainly focus on the heart and identify early signs of calcium build up or blockages within the heart arteries. It is not 100% accurate for identifying blockages or heart disease, but it is useful to help us  predict who may have some early changes or be at risk in the future for a heart attack or cardiovascular problem.  The results are reviewed by a Cardiologist and they will document the results. It should become available on MyChart. Typically the results are divided into percentiles based on other patients of the same demographic and age. So it will compare your risk to others similar to you. If you have a higher score >99 or higher percentile >75%tile, it is recommended to consider Statin cholesterol therapy and or referral to Cardiologist. I will try to help explain your results and if we have questions we can contact the Cardiologist.  You will be contacted for scheduling. Usually it is done at any imaging facility through [facility], [facility] or [facility].  The cost is $99 flat fee total and it does not go through insurance, so no authorization is required.  DUE for  FASTING BLOOD WORK (no food or drink after midnight before the lab appointment, only water or coffee without cream/sugar on the morning of)  SCHEDULE Lab Only visit in the morning at the clinic for lab draw in 1 YEAR  - Make sure Lab Only appointment is at about 1 week before your next appointment, so that results will be available  For Lab Results, once available within 2-3 days of blood draw, you can can log in to MyChart online to view your results and a brief explanation. Also, we can discuss results at next follow-up visit.   Please schedule a Follow-up Appointment to: Return for 1 year fasting lab > 1 week later Annual Physical.  If you have any other questions or concerns, please feel free to call the office or send a message through MyChart. You may also schedule an earlier appointment if necessary.  Additionally, you may be receiving a survey about your experience at our office within a few days to 1 week by e-mail or mail. We value your feedback.  Marsa Officer, DO [facility], [place]

## 2023-11-30 NOTE — Progress Notes (Signed)
 Subjective:    Patient ID: Jonathan Ortega, male    DOB: 1961-04-20, 63 y.o.   MRN: [id number]  Jonathan Ortega is a 63 y.o. male presenting on 11/30/2023 for Annual Exam   HPI  Discussed the use of AI scribe software for clinical note transcription with the patient, who gave verbal consent to proceed.  History of Present Illness   Jonathan Ortega is a 63 year old male who presents for routine follow-up and colonoscopy scheduling.    Hand Dermatitis - Dermatitis affecting hands, flares with exposure to certain soaps or chemicals - Uses topical treatments as needed  FOLLOW-UP GERD: Last EGD 01/05/18 done by [facility]. Showed gastrc polyps negative and some mild erythema. He was on H2 Zantac  but then recalled, now he remains on PPI Omeprazole  20mg  daily doing well - Continues on this OTC Mother's side with maternal uncle of family with esophageal cancer   Pre-Diabetes A1c 5.7, previously 5.8 Prior range 5.4 to 5.5 - Never on medication Lifestyle: - Weight stable - Diet: Improving reduce sugar / lower carb healthy diet, rare soda, drinks unsweet tea, water, coffee - Exercise: Walking up to 4 miles daily - No significant family history of DM Past history [facility] report 10/14/20 - background DM retinopathy but no treatment required. Denies hypoglycemia   CHRONIC HTN: Reports no new concerns. BP readings normal Current Meds - Amlodipine  5mg  daily   Reports good compliance, took meds today. Tolerating well, w/o complaints.   HYPERLIPIDEMIA: - Reports no concerns. Last lipids 11/2023, LDL 113 prior 108 Not on statin. Considering - Taking ASA 81mg  daily for primary prevention. No known CAD / MI / CVA - Not taking any medicines OTC or rx for cholesterol. Never on statin.   BPH with LUTS, controlled - Reports chronic problem for >6-7 years. Taking generic silodosin  8mg  daily good results, previously discussed he was on brand rapaflo  with some good results, due to cost switched, has  maintained on this generic now with good results. Still has mild nocturia 1-2 times at night and some slight inc urgency at times otherwise reduced urinary frequency, still has some reduced strength to urinary stream at end. Controlled mostly on Silodosin , due for refill   Vitamin D  deficiency Continues onVitamin D3 1,000 daily        Health Maintenance   - Prostate cancer screening: No known personal or family history, last PSA 1.12 (11/2023) prior 0.6 (11/2022), and (prior 0.57 (11/2021), last DRE reported prostate was only mildly enlarged, see above BPH LUTS   - Colonoscopy last done 01/05/18 (Dr Janalyn [facility]) multiple polyp benign repeat 5 years, there was one polyp unable to retreive will need surgical resection, TBD currently scheduling. Next is 2024 - he has family history of esophageal cancer Overdue for Colonoscopy repeat   Varicella IgG 2109, proves history of Chicken Pox, eligible for Shingrix but decline today.  Future Prevnar-20      11/30/2023   10:05 AM 11/30/2022    9:00 AM 11/23/2021    9:03 AM  Depression screen PHQ 2/9  Decreased Interest 0 0 0  Down, Depressed, Hopeless 0 0 0  PHQ - 2 Score 0 0 0  Altered sleeping  0 0  Tired, decreased energy  0 1  Change in appetite  0 0  Feeling bad or failure about yourself   0 0  Trouble concentrating  0 0  Moving slowly or fidgety/restless  0 0  Suicidal thoughts  0 0  PHQ-9 Score  0 1  Difficult doing work/chores  Not difficult at all Not difficult at all       11/30/2023   10:05 AM 11/30/2022    9:00 AM 11/23/2021    9:04 AM 07/09/2018   11:07 AM  GAD 7 : Generalized Anxiety Score  Nervous, Anxious, on Edge 0 0 0 1  Control/stop worrying 0 0 0 1  Worry too much - different things 0 0 0 1  Trouble relaxing 0 0 0 1  Restless 0 0 0 0  Easily annoyed or irritable 0 0 1 2  Afraid - awful might happen 0  0 0  Total GAD 7 Score 0  1 6  Anxiety Difficulty  Not difficult at all Not difficult at all Not difficult at all      Past Medical History:  Diagnosis Date   BPH associated with nocturia    GERD (gastroesophageal reflux disease)    Hemorrhoids    History of anal fissures 1990s   s/p  repair   Hypertension    Hypertrophy, papillae, anal    Mixed hyperlipidemia    OA (osteoarthritis)    Vitamin D  deficiency    Wears glasses    Past Surgical History:  Procedure Laterality Date   COLONOSCOPY WITH PROPOFOL  N/A 01/05/2018   Procedure: COLONOSCOPY WITH PROPOFOL ;  Surgeon: Janalyn Keene NOVAK, MD;  Location: [facility];  Service: Endoscopy;  Laterality: N/A;   ESOPHAGOGASTRODUODENOSCOPY (EGD) WITH PROPOFOL  N/A 01/05/2018   Procedure: ESOPHAGOGASTRODUODENOSCOPY (EGD) WITH PROPOFOL ;  Surgeon: Janalyn Keene NOVAK, MD;  Location: [facility];  Service: Endoscopy;  Laterality: N/A;   EXCISION OF SKIN TAG N/A 12/28/2018   Procedure: EXCISION OF ANAL PAPPILLA;  Surgeon: Debby Hila, MD;  Location: [facility];  Service: General;  Laterality: N/A;   Social History   Socioeconomic History   Marital status: Single    Spouse name: Not on file   Number of children: Not on file   Years of education: Not on file   Highest education level: Bachelor's degree (e.g., BA, AB, BS)  Occupational History   Not on file  Tobacco Use   Smoking status: Never   Smokeless tobacco: Never  Vaping Use   Vaping status: Never Used  Substance and Sexual Activity   Alcohol use: Yes    Alcohol/week: 0.0 standard drinks of alcohol    Comment: occasional   Drug use: Never   Sexual activity: Not on file  Other Topics Concern   Not on file  Social History Narrative   Not on file   Social Drivers of Health   Financial Resource Strain: Low Risk  (11/29/2023)   Overall Financial Resource Strain (CARDIA)    Difficulty of Paying Living Expenses: Not hard at all  Food Insecurity: No Food Insecurity (11/29/2023)   Hunger Vital Sign    Worried About Running Out of Food in the Last Year: Never true     Ran Out of Food in the Last Year: Never true  Transportation Needs: No Transportation Needs (11/29/2023)   PRAPARE - Administrator, Civil Service (Medical): No    Lack of Transportation (Non-Medical): No  Physical Activity: Sufficiently Active (11/29/2023)   Exercise Vital Sign    Days of Exercise per Week: 5 days    Minutes of Exercise per Session: 80 min  Stress: No Stress Concern Present (11/29/2023)   [employer] of Occupational Health - Occupational Stress Questionnaire    Feeling of Stress:  Not at all  Social Connections: Unknown (11/29/2023)   Social Connection and Isolation Panel    Frequency of Communication with Friends and Family: Patient declined    Frequency of Social Gatherings with Friends and Family: Patient declined    Attends Religious Services: Patient declined    Database administrator or Organizations: Patient declined    Attends Engineer, structural: Not on file    Marital Status: Patient declined  Catering manager Violence: Not on file   Family History  Problem Relation Age of Onset   Hypertension Paternal Uncle    Hypertension Maternal Grandmother    Ovarian cancer Mother    Esophageal cancer Maternal Uncle    Current Outpatient Medications on File Prior to Visit  Medication Sig   aspirin EC 81 MG tablet Take 81 mg by mouth daily.   cholecalciferol (VITAMIN D ) 1000 UNITS tablet Take 1,000 Units by mouth daily.   hydrocortisone  (PROCTOZONE -HC) 2.5 % rectal cream Place 1 Application rectally 2 (two) times daily.   ibuprofen (ADVIL,MOTRIN) 200 MG tablet Take 200 mg by mouth every 6 (six) hours as needed.   loratadine (CLARITIN) 10 MG tablet Take 10 mg by mouth daily as needed.    Multiple Vitamin (MULTIVITAMIN) tablet Take 1 tablet by mouth daily.   Potassium 99 MG TABS Take by mouth.   psyllium (METAMUCIL) 58.6 % packet Take 1 packet by mouth daily.   No current facility-administered medications on file prior to visit.    Review  of Systems  Constitutional:  Negative for activity change, appetite change, chills, diaphoresis, fatigue and fever.  HENT:  Negative for congestion and hearing loss.   Eyes:  Negative for visual disturbance.  Respiratory:  Negative for cough, chest tightness, shortness of breath and wheezing.   Cardiovascular:  Negative for chest pain, palpitations and leg swelling.  Gastrointestinal:  Negative for abdominal pain, constipation, diarrhea, nausea and vomiting.  Genitourinary:  Negative for dysuria, frequency and hematuria.  Musculoskeletal:  Negative for arthralgias and neck pain.  Skin:  Negative for rash.  Neurological:  Negative for dizziness, weakness, light-headedness, numbness and headaches.  Hematological:  Negative for adenopathy.  Psychiatric/Behavioral:  Negative for behavioral problems, dysphoric mood and sleep disturbance.    Per HPI unless specifically indicated above     Objective:    BP 118/70 (BP Location: Left Arm, Patient Position: Sitting, Cuff Size: Normal)   Pulse 74   Ht 5' 9 (1.753 m)   Wt 194 lb 8 oz (88.2 kg)   SpO2 96%   BMI 28.72 kg/m   Wt Readings from Last 3 Encounters:  11/30/23 194 lb 8 oz (88.2 kg)  11/30/22 204 lb (92.5 kg)  11/23/21 204 lb (92.5 kg)    Physical Exam Vitals and nursing note reviewed.  Constitutional:      General: He is not in acute distress.    Appearance: He is well-developed. He is not diaphoretic.     Comments: Well-appearing, comfortable, cooperative  HENT:     Head: Normocephalic and atraumatic.     Right Ear: Tympanic membrane, ear canal and external ear normal. There is no impacted cerumen.     Left Ear: Tympanic membrane, ear canal and external ear normal. There is no impacted cerumen.   Eyes:     General:        Right eye: No discharge.        Left eye: No discharge.     Conjunctiva/sclera: Conjunctivae normal.  Pupils: Pupils are equal, round, and reactive to light.   Neck:     Thyroid: No thyromegaly.      Vascular: No carotid bruit.   Cardiovascular:     Rate and Rhythm: Normal rate and regular rhythm.     Pulses: Normal pulses.     Heart sounds: Normal heart sounds. No murmur heard. Pulmonary:     Effort: Pulmonary effort is normal. No respiratory distress.     Breath sounds: Normal breath sounds. No wheezing or rales.  Abdominal:     General: Bowel sounds are normal. There is no distension.     Palpations: Abdomen is soft. There is no mass.     Tenderness: There is no abdominal tenderness.   Musculoskeletal:        General: No tenderness. Normal range of motion.     Cervical back: Normal range of motion and neck supple.     Right lower leg: No edema.     Left lower leg: No edema.     Comments: Upper / Lower Extremities: - Normal muscle tone, strength bilateral upper extremities 5/5, lower extremities 5/5  Lymphadenopathy:     Cervical: No cervical adenopathy.   Skin:    General: Skin is warm and dry.     Findings: No erythema or rash.   Neurological:     Mental Status: He is alert and oriented to person, place, and time.     Comments: Distal sensation intact to light touch all extremities  Psychiatric:        Mood and Affect: Mood normal.        Behavior: Behavior normal.        Thought Content: Thought content normal.     Comments: Well groomed, good eye contact, normal speech and thoughts     Recent Labs    11/24/23 0755  HGBA1C 5.7*    Results for orders placed or performed in visit on 11/24/23  TSH   Collection Time: 11/24/23  7:55 AM  Result Value Ref Range   TSH 2.77 0.40 - 4.50 mIU/L  PSA   Collection Time: 11/24/23  7:55 AM  Result Value Ref Range   PSA 1.12 < OR = 4.00 ng/mL  Lipid panel   Collection Time: 11/24/23  7:55 AM  Result Value Ref Range   Cholesterol 173 <200 mg/dL   HDL 35 (L) > OR = 40 mg/dL   Triglycerides 866 <849 mg/dL   LDL Cholesterol (Calc) 113 (H) mg/dL (calc)   Total CHOL/HDL Ratio 4.9 <5.0 (calc)   Non-HDL Cholesterol  (Calc) 138 (H) <130 mg/dL (calc)  Hemoglobin J8r   Collection Time: 11/24/23  7:55 AM  Result Value Ref Range   Hgb A1c MFr Bld 5.7 (H) <5.7 %   Mean Plasma Glucose 117 mg/dL   eAG (mmol/L) 6.5 mmol/L  CBC with Differential/Platelet   Collection Time: 11/24/23  7:55 AM  Result Value Ref Range   WBC 6.9 3.8 - 10.8 Thousand/uL   RBC 4.81 4.20 - 5.80 Million/uL   Hemoglobin 15.1 13.2 - 17.1 g/dL   HCT 53.3 61.4 - 49.9 %   MCV 96.9 80.0 - 100.0 fL   MCH 31.4 27.0 - 33.0 pg   MCHC 32.4 32.0 - 36.0 g/dL   RDW 87.8 88.9 - 84.9 %   Platelets 167 140 - 400 Thousand/uL   MPV 10.9 7.5 - 12.5 fL   Neutro Abs 4,271 1,500 - 7,800 cells/uL   Absolute Lymphocytes 1,532 850 - 3,900  cells/uL   Absolute Monocytes 731 200 - 950 cells/uL   Eosinophils Absolute 297 15 - 500 cells/uL   Basophils Absolute 69 0 - 200 cells/uL   Neutrophils Relative % 61.9 %   Total Lymphocyte 22.2 %   Monocytes Relative 10.6 %   Eosinophils Relative 4.3 %   Basophils Relative 1.0 %  COMPLETE METABOLIC PANEL WITH GFR   Collection Time: 11/24/23  7:55 AM  Result Value Ref Range   Glucose, Bld 102 (H) 65 - 99 mg/dL   BUN 12 7 - 25 mg/dL   Creat 8.81 9.29 - 8.64 mg/dL   BUN/Creatinine Ratio SEE NOTE: 6 - 22 (calc)   Sodium 141 135 - 146 mmol/L   Potassium 4.2 3.5 - 5.3 mmol/L   Chloride 105 98 - 110 mmol/L   CO2 27 20 - 32 mmol/L   Calcium 9.2 8.6 - 10.3 mg/dL   Total Protein 6.8 6.1 - 8.1 g/dL   Albumin 4.5 3.6 - 5.1 g/dL   Globulin 2.3 1.9 - 3.7 g/dL (calc)   AG Ratio 2.0 1.0 - 2.5 (calc)   Total Bilirubin 1.3 (H) 0.2 - 1.2 mg/dL   Alkaline phosphatase (APISO) 70 35 - 144 U/L   AST 22 10 - 35 U/L   ALT 22 9 - 46 U/L      Assessment & Plan:   Problem List Items Addressed This Visit     BPH (benign prostatic hyperplasia)   Relevant Medications   silodosin  (RAPAFLO ) 8 MG CAPS capsule   Elevated hemoglobin A1c   Essential hypertension   Relevant Medications   amLODipine  (NORVASC ) 5 MG tablet   Other  Relevant Orders   CT CARDIAC SCORING (SELF PAY ONLY)   GERD (gastroesophageal reflux disease)   Relevant Medications   omeprazole  (PRILOSEC) 20 MG capsule   Hyperlipidemia   Relevant Medications   amLODipine  (NORVASC ) 5 MG tablet   Other Relevant Orders   CT CARDIAC SCORING (SELF PAY ONLY)   Other Visit Diagnoses       Annual physical exam    -  Primary     Dyshidrotic eczema       Relevant Medications   clobetasol cream (TEMOVATE) 0.05 %     Polyp of colon, unspecified part of colon, unspecified type       Relevant Orders   Ambulatory referral to Gastroenterology     Screen for colon cancer       Relevant Orders   Ambulatory referral to Gastroenterology        Updated Health Maintenance information Reviewed recent lab results with patient Encouraged improvement to lifestyle with diet and exercise Goal of weight loss   Colorectal Cancer Screening Overdue for colonoscopy since August 2019 due to previous polyp removal. Recommended repeat colonoscopy due to multiple benign polyps and one unretrieved polyp. Cologuard not recommended due to previous findings.  Discussed coverage that procedure expected as zero-dollar preventive service, though hospital fees may apply for the procedure - Refer to GI at [facility] for colonoscopy. Since previous provider no longer available in [facility] - Coordinate with referral coordinator to confirm insurance coverage and network status.  Hyperlipidemia LDL cholesterol at 113, slightly elevated. Undecided on statin therapy. Recommend calcium score heart scan to assess arterial blockage and guide statin decision. Scan costs $99, not covered by insurance. Statins may cause muscle aches, low dose considered due to mild risk factors. - Order calcium score heart scan. - Discuss potential statin therapy based on scan  results. - If his scan is normal he has preferred to defer statin   The 10-year ASCVD risk score (Arnett DK, et al., 2019)  is: 10.7%   Values used to calculate the score:     Age: 53 years     Clincally relevant sex: Male     Is Non-Hispanic African American: No     Diabetic: No     Tobacco smoker: No     Systolic Blood Pressure: 118 mmHg     Is BP treated: Yes     HDL Cholesterol: 35 mg/dL     Total Cholesterol: 173 mg/dL   Prediabetes Mild prediabetes with A1c at 5.7, improved from 5.8 last year. No medication required. Emphasis on lifestyle and dietary modifications to manage carbohydrate, starch, and sugar intake. - Advise on lifestyle and dietary modifications to manage blood sugar levels.  Prostate Health PSA level increased from 0.6 to 1.12 over the past year, within normal range. Enlarged prostate and urinary concerns present. No immediate concern for prostate cancer. - Continue annual PSA monitoring.  Dyshidrotic Eczema vs Dermatitis Dermatitis with rough, dry, itchy skin exacerbated by certain soaps or chemicals. Previously managed with topical steroids. - Prescribe 30g tube of topical steroid Clobetasol PRN to [facility].  General Health Maintenance Eligible for shingles and pneumonia vaccinations. Discussed potential future vaccinations. - Discuss shingles and pneumonia vaccinations at future visits.  Follow-up Requires follow-up for colonoscopy and heart scan results. Routine follow-up scheduled for next year. - Schedule follow-up appointment for next year. - Advise him to monitor for contact regarding colonoscopy and heart scan scheduling.         Orders Placed This Encounter  Procedures   CT CARDIAC SCORING (SELF PAY ONLY)    Standing Status:   Future    Expiration Date:   11/29/2024    Preferred imaging location?:   [facility]   Ambulatory referral to Gastroenterology    Referral Priority:   Routine    Referral Type:   Consultation    Referral Reason:   Specialty Services Required    Number of Visits Requested:   1    Meds ordered this encounter  Medications    silodosin  (RAPAFLO ) 8 MG CAPS capsule    Sig: Take 1 capsule (8 mg total) by mouth daily with breakfast.    Dispense:  30 capsule    Refill:  11   omeprazole  (PRILOSEC) 20 MG capsule    Sig: Take 1 capsule (20 mg total) by mouth daily before breakfast.   amLODipine  (NORVASC ) 5 MG tablet    Sig: Take 1 tablet (5 mg total) by mouth daily.    Dispense:  30 tablet    Refill:  11   clobetasol cream (TEMOVATE) 0.05 %    Sig: Apply 1 Application topically 2 (two) times daily. For up to 2 weeks as needed for eczema flares    Dispense:  30 g    Refill:  0     Follow up plan: Return for 1 year fasting lab > 1 week later Annual Physical.  Future labs 12/02/24  Marsa Officer, DO [facility] 11/30/2023, 10:52 AM

## 2023-12-04 ENCOUNTER — Ambulatory Visit
Admission: RE | Admit: 2023-12-04 | Discharge: 2023-12-04 | Disposition: A | Discharge status: Home / Self Care | Source: Ambulatory Visit | Attending: Family Medicine | Admitting: Family Medicine

## 2023-12-04 DIAGNOSIS — E782 Mixed hyperlipidemia: Secondary | ICD-10-CM | POA: Insufficient documentation

## 2023-12-04 DIAGNOSIS — I10 Essential (primary) hypertension: Secondary | ICD-10-CM | POA: Insufficient documentation

## 2023-12-05 ENCOUNTER — Ambulatory Visit: Payer: Self-pay | Admitting: Family Medicine

## 2024-03-11 DIAGNOSIS — Z01818 Encounter for other preprocedural examination: Secondary | ICD-10-CM | POA: Diagnosis not present

## 2024-03-11 DIAGNOSIS — Z860100 Personal history of colon polyps, unspecified: Secondary | ICD-10-CM | POA: Diagnosis not present

## 2024-03-12 ENCOUNTER — Encounter: Payer: Self-pay | Admitting: Gastroenterology

## 2024-03-12 ENCOUNTER — Other Ambulatory Visit: Payer: Self-pay

## 2024-03-18 ENCOUNTER — Ambulatory Visit: Payer: Self-pay | Admitting: Anesthesiology

## 2024-03-18 ENCOUNTER — Encounter
Admission: RE | Disposition: A | Discharge status: Home / Self Care | Payer: Self-pay | Source: Home / Self Care | Attending: Gastroenterology

## 2024-03-18 ENCOUNTER — Encounter: Payer: Self-pay | Admitting: Gastroenterology

## 2024-03-18 ENCOUNTER — Other Ambulatory Visit: Payer: Self-pay

## 2024-03-18 ENCOUNTER — Ambulatory Visit
Admission: RE | Admit: 2024-03-18 | Discharge: 2024-03-18 | Disposition: A | Discharge status: Home / Self Care | Attending: Gastroenterology | Admitting: Gastroenterology

## 2024-03-18 DIAGNOSIS — M1990 Unspecified osteoarthritis, unspecified site: Secondary | ICD-10-CM | POA: Diagnosis not present

## 2024-03-18 DIAGNOSIS — K219 Gastro-esophageal reflux disease without esophagitis: Secondary | ICD-10-CM | POA: Insufficient documentation

## 2024-03-18 DIAGNOSIS — Z860100 Personal history of colon polyps, unspecified: Secondary | ICD-10-CM | POA: Diagnosis not present

## 2024-03-18 DIAGNOSIS — N400 Enlarged prostate without lower urinary tract symptoms: Secondary | ICD-10-CM | POA: Diagnosis not present

## 2024-03-18 DIAGNOSIS — I10 Essential (primary) hypertension: Secondary | ICD-10-CM | POA: Diagnosis not present

## 2024-03-18 DIAGNOSIS — K5730 Diverticulosis of large intestine without perforation or abscess without bleeding: Secondary | ICD-10-CM | POA: Insufficient documentation

## 2024-03-18 DIAGNOSIS — Z09 Encounter for follow-up examination after completed treatment for conditions other than malignant neoplasm: Secondary | ICD-10-CM | POA: Diagnosis not present

## 2024-03-18 DIAGNOSIS — Z1211 Encounter for screening for malignant neoplasm of colon: Secondary | ICD-10-CM | POA: Insufficient documentation

## 2024-03-18 DIAGNOSIS — Z79899 Other long term (current) drug therapy: Secondary | ICD-10-CM | POA: Diagnosis not present

## 2024-03-18 DIAGNOSIS — Z7982 Long term (current) use of aspirin: Secondary | ICD-10-CM | POA: Insufficient documentation

## 2024-03-18 DIAGNOSIS — E785 Hyperlipidemia, unspecified: Secondary | ICD-10-CM | POA: Diagnosis not present

## 2024-03-18 HISTORY — PX: COLONOSCOPY: SHX5424

## 2024-03-18 SURGERY — COLONOSCOPY
Anesthesia: General | Site: Rectum

## 2024-03-18 MED ORDER — STERILE WATER FOR IRRIGATION IR SOLN
Status: DC | PRN
  Administered 2024-03-18: 500 mL

## 2024-03-18 MED ORDER — SODIUM CHLORIDE 0.9 % IV SOLN: INTRAVENOUS | Status: DC

## 2024-03-18 MED ORDER — PROPOFOL 10 MG/ML IV BOLUS
INTRAVENOUS | Status: DC | PRN
  Administered 2024-03-18: 80 mg via INTRAVENOUS
  Administered 2024-03-18 (×2): 30 mg via INTRAVENOUS
  Administered 2024-03-18: 20 mg via INTRAVENOUS
  Administered 2024-03-18: 40 mg via INTRAVENOUS

## 2024-03-18 MED ORDER — LACTATED RINGERS IV SOLN: INTRAVENOUS | Status: DC

## 2024-03-18 MED ORDER — LIDOCAINE HCL (CARDIAC) PF 100 MG/5ML IV SOSY
PREFILLED_SYRINGE | INTRAVENOUS | Status: DC | PRN
  Administered 2024-03-18: 50 mg via INTRAVENOUS

## 2024-03-18 SURGICAL SUPPLY — 16 items

## 2024-03-18 NOTE — Anesthesia Preprocedure Evaluation (Signed)
 Anesthesia Evaluation  Patient identified by MRN, date of birth, ID band Patient awake    Reviewed: Allergy & Precautions, H&P , NPO status , Patient's Chart, lab work & pertinent test results  Airway Mallampati: III  TM Distance: <3 FB Neck ROM: Full    Dental no notable dental hx.    Pulmonary neg pulmonary ROS   Pulmonary exam normal breath sounds clear to auscultation       Cardiovascular hypertension, negative cardio ROS Normal cardiovascular exam Rhythm:Regular Rate:Normal     Neuro/Psych negative neurological ROS  negative psych ROS   GI/Hepatic negative GI ROS, Neg liver ROS,GERD  ,,  Endo/Other  negative endocrine ROS    Renal/GU negative Renal ROS  negative genitourinary   Musculoskeletal negative musculoskeletal ROS (+) Arthritis ,    Abdominal   Peds negative pediatric ROS (+)  Hematology negative hematology ROS (+)   Anesthesia Other Findings Medical History  GERD (gastroesophageal reflux disease) Hypertension History of anal fissures  Mixed hyperlipidemia Vitamin D  deficiency  Hypertrophy, papillae, anal Hemorrhoids  BPH associated with nocturia OA (osteoarthritis)  Wears glasses    Reproductive/Obstetrics negative OB ROS                              Anesthesia Physical Anesthesia Plan  ASA: 2  Anesthesia Plan: General   Post-op Pain Management:    Induction: Intravenous  PONV Risk Score and Plan:   Airway Management Planned: Natural Airway and Nasal Cannula  Additional Equipment:   Intra-op Plan:   Post-operative Plan:   Informed Consent: I have reviewed the patients History and Physical, chart, labs and discussed the procedure including the risks, benefits and alternatives for the proposed anesthesia with the patient or authorized representative who has indicated his/her understanding and acceptance.     Dental Advisory Given  Plan Discussed  with: Anesthesiologist, CRNA and Surgeon  Anesthesia Plan Comments: (Patient consented for risks of anesthesia including but not limited to:  - adverse reactions to medications - risk of airway placement if required - damage to eyes, teeth, lips or other oral mucosa - nerve damage due to positioning  - sore throat or hoarseness - Damage to heart, brain, nerves, lungs, other parts of body or loss of life  Patient voiced understanding and assent.)        Anesthesia Quick Evaluation

## 2024-03-18 NOTE — H&P (Signed)
 Corinn JONELLE Brooklyn, MD [facility], DHIP [address]  [place], [place] [address]  Main: [phone] Fax:  [phone] Pager: [phone]   Primary Care Physician:  Edman Marsa PARAS, DO Primary Gastroenterologist:  Dr. Corinn JONELLE Brooklyn  Pre-Procedure History & Physical: HPI:  Jonathan Ortega is a 63 y.o. male is here for an colonoscopy.   Past Medical History:  Diagnosis Date   BPH associated with nocturia    GERD (gastroesophageal reflux disease)    Hemorrhoids    History of anal fissures 1990s   s/p  repair   Hypertension    Hypertrophy, papillae, anal    Mixed hyperlipidemia    OA (osteoarthritis)    Vitamin D  deficiency    Wears glasses     Past Surgical History:  Procedure Laterality Date   COLONOSCOPY WITH PROPOFOL  N/A 01/05/2018   Procedure: COLONOSCOPY WITH PROPOFOL ;  Surgeon: Janalyn Keene NOVAK, MD;  Location: [facility] ENDOSCOPY;  Service: Endoscopy;  Laterality: N/A;   ESOPHAGOGASTRODUODENOSCOPY (EGD) WITH PROPOFOL  N/A 01/05/2018   Procedure: ESOPHAGOGASTRODUODENOSCOPY (EGD) WITH PROPOFOL ;  Surgeon: Janalyn Keene NOVAK, MD;  Location: [facility] ENDOSCOPY;  Service: Endoscopy;  Laterality: N/A;   EXCISION OF SKIN TAG N/A 12/28/2018   Procedure: EXCISION OF ANAL PAPPILLA;  Surgeon: Debby Hila, MD;  Location: [facility];  Service: General;  Laterality: N/A;    Prior to Admission medications   Medication Sig Start Date End Date Taking? Authorizing Provider  amLODipine  (NORVASC ) 5 MG tablet Take 1 tablet (5 mg total) by mouth daily. 11/30/23  Yes Karamalegos, Marsa PARAS, DO  aspirin EC 81 MG tablet Take 81 mg by mouth daily.   Yes [provider]  cholecalciferol (VITAMIN D ) 1000 UNITS tablet Take 1,000 Units by mouth daily.   Yes [provider]  clobetasol  cream (TEMOVATE ) 0.05 % Apply 1 Application topically 2 (two) times daily. For up to 2 weeks as needed for eczema flares 11/30/23  Yes Karamalegos,  Marsa PARAS, DO  hydrocortisone  (PROCTOZONE -HC) 2.5 % rectal cream Place 1 Application rectally 2 (two) times daily.   Yes [provider]  ibuprofen (ADVIL,MOTRIN) 200 MG tablet Take 200 mg by mouth every 6 (six) hours as needed.   Yes [provider]  loratadine (CLARITIN) 10 MG tablet Take 10 mg by mouth daily as needed.    Yes [provider]  Multiple Vitamin (MULTIVITAMIN) tablet Take 1 tablet by mouth daily.   Yes [provider]  omeprazole  (PRILOSEC) 20 MG capsule Take 1 capsule (20 mg total) by mouth daily before breakfast. 11/30/23  Yes Karamalegos, Marsa PARAS, DO  Potassium 99 MG TABS Take by mouth.   Yes [provider]  psyllium (METAMUCIL) 58.6 % packet Take 1 packet by mouth daily.   Yes [provider]  silodosin  (RAPAFLO ) 8 MG CAPS capsule Take 1 capsule (8 mg total) by mouth daily with breakfast. 11/30/23  Yes Edman, Marsa PARAS, DO    Allergies as of 03/11/2024   (No Known Allergies)    Family History  Problem Relation Age of Onset   Hypertension Paternal Uncle    Hypertension Maternal Grandmother    Ovarian cancer Mother    Esophageal cancer Maternal Uncle     Social History   Socioeconomic History   Marital status: Single    Spouse name: Not on file   Number of children: Not on file   Years of education: Not on file   Highest education level: Bachelor's degree (e.g.,  BA, AB, BS)  Occupational History   Not on file  Tobacco Use   Smoking status: Never   Smokeless tobacco: Never  Vaping Use   Vaping status: Never Used  Substance and Sexual Activity   Alcohol use: Yes    Alcohol/week: 0.0 standard drinks of alcohol    Comment: occasional   Drug use: Never   Sexual activity: Not on file  Other Topics Concern   Not on file  Social History Narrative   Not on file   Social Drivers of Health   Financial Resource Strain: Low Risk  (11/29/2023)   Overall Financial Resource Strain (CARDIA)     Difficulty of Paying Living Expenses: Not hard at all  Food Insecurity: No Food Insecurity (11/29/2023)   Hunger Vital Sign    Worried About Running Out of Food in the Last Year: Never true    Ran Out of Food in the Last Year: Never true  Transportation Needs: No Transportation Needs (11/29/2023)   PRAPARE - Administrator, Civil Service (Medical): No    Lack of Transportation (Non-Medical): No  Physical Activity: Sufficiently Active (11/29/2023)   Exercise Vital Sign    Days of Exercise per Week: 5 days    Minutes of Exercise per Session: 80 min  Stress: No Stress Concern Present (11/29/2023)   [employer] of Occupational Health - Occupational Stress Questionnaire    Feeling of Stress: Not at all  Social Connections: Unknown (11/29/2023)   Social Connection and Isolation Panel    Frequency of Communication with Friends and Family: Patient declined    Frequency of Social Gatherings with Friends and Family: Patient declined    Attends Religious Services: Patient declined    Database administrator or Organizations: Patient declined    Attends Engineer, structural: Not on file    Marital Status: Patient declined  Catering manager Violence: Not on file    Review of Systems: See HPI, otherwise negative ROS  Physical Exam: BP (!) 156/81   Pulse 66   Temp 97.6 F (36.4 C) (Temporal)   Resp 16   Ht 5' 9 (1.753 m)   Wt 85.7 kg   SpO2 99%   BMI 27.91 kg/m  General:   Alert,  pleasant and cooperative in NAD Head:  Normocephalic and atraumatic. Neck:  Supple; no masses or thyromegaly. Lungs:  Clear throughout to auscultation.    Heart:  Regular rate and rhythm. Abdomen:  Soft, nontender and nondistended. Normal bowel sounds, without guarding, and without rebound.   Neurologic:  Alert and  oriented x4;  grossly normal neurologically.  Impression/Plan: Jonathan Ortega is here for a colonoscopy to be performed for h/o colon polyps  Risks, benefits,  limitations, and alternatives regarding  colonoscopy have been reviewed with the patient.  Questions have been answered.  All parties agreeable.   Corinn Brooklyn, MD  03/18/2024, 7:47 AM

## 2024-03-18 NOTE — Op Note (Signed)
 [facility] Patient Name: Jonathan Ortega Procedure Date: 03/18/2024 8:41 AM MRN: [id number] Account #: [id number] Date of Birth: [birth year] Admit Type: Outpatient Age: 63 Room: [facility] OR ROOM 01 Gender: Male Note Status: Finalized Instrument Name: Arvis [id number] Procedure:             Colonoscopy Indications:           Screening for colorectal malignant neoplasm, This is                         the patient's first colonoscopy Providers:             Corinn Jess Brooklyn MD, MD Referring MD:          Marsa DOROTHA Officer (Referring MD) Medicines:             General Anesthesia Complications:         No immediate complications. Estimated blood loss: None. Procedure:             Pre-Anesthesia Assessment:                        - Prior to the procedure, a History and Physical was                         performed, and patient medications and allergies were                         reviewed. The patient is competent. The risks and                         benefits of the procedure and the sedation options and                         risks were discussed with the patient. All questions                         were answered and informed consent was obtained.                         Patient identification and proposed procedure were                         verified by the physician, the nurse, the                         anesthesiologist, the anesthetist and the technician                         in the pre-procedure area in the procedure room in the                         endoscopy suite. Mental Status Examination: alert and                         oriented. Airway Examination: normal oropharyngeal                         airway and neck mobility. Respiratory Examination:  clear to auscultation. CV Examination: normal.                         Prophylactic Antibiotics: The patient does not require                          prophylactic antibiotics. Prior Anticoagulants: The                         patient has taken no anticoagulant or antiplatelet                         agents. ASA Grade Assessment: II - A patient with mild                         systemic disease. After reviewing the risks and                         benefits, the patient was deemed in satisfactory                         condition to undergo the procedure. The anesthesia                         plan was to use general anesthesia. Immediately prior                         to administration of medications, the patient was                         re-assessed for adequacy to receive sedatives. The                         heart rate, respiratory rate, oxygen saturations,                         blood pressure, adequacy of pulmonary ventilation, and                         response to care were monitored throughout the                         procedure. The physical status of the patient was                         re-assessed after the procedure.                        After obtaining informed consent, the colonoscope was                         passed under direct vision. Throughout the procedure,                         the patient's blood pressure, pulse, and oxygen                         saturations were monitored continuously. The  Colonoscope was introduced through the anus and                         advanced to the the cecum, identified by appendiceal                         orifice and ileocecal valve. The colonoscopy was                         performed without difficulty. The patient tolerated                         the procedure well. The quality of the bowel                         preparation was evaluated using the BBPS [facility] Bowel                         Preparation Scale) with scores of: Right Colon = 3,                         Transverse Colon = 3 and Left Colon = 3 (entire mucosa                          seen well with no residual staining, small fragments                         of stool or opaque liquid). The total BBPS score                         equals 9. The ileocecal valve, appendiceal orifice,                         and rectum were photographed. Findings:      The perianal and digital rectal examinations were normal. Pertinent       negatives include normal sphincter tone and no palpable rectal lesions.      The entire examined colon appeared normal.      Multiple medium-mouthed diverticula were found in the recto-sigmoid       colon and sigmoid colon.      The retroflexed view of the distal rectum and anal verge was normal and       showed no anal or rectal abnormalities. Impression:            - The entire examined colon is normal.                        - Diverticulosis in the recto-sigmoid colon and in the                         sigmoid colon.                        - The distal rectum and anal verge are normal on                         retroflexion view.                        -  No specimens collected. Recommendation:        - Discharge patient to home (with escort).                        - Resume previous diet today.                        - Continue present medications.                        - Repeat colonoscopy in 10 years for screening                         purposes. Procedure Code(s):     --- Professional ---                        H9878, Colorectal cancer screening; colonoscopy on                         individual not meeting criteria for high risk Diagnosis Code(s):     --- Professional ---                        Z12.11, Encounter for screening for malignant neoplasm                         of colon                        K57.30, Diverticulosis of large intestine without                         perforation or abscess without bleeding CPT copyright 2022 American Medical Association. All rights reserved. The codes documented in this report are  preliminary and upon coder review may  be revised to meet current compliance requirements. Dr. Corinn Brooklyn Corinn Jess Brooklyn MD, MD 03/18/2024 9:06:42 AM This report has been signed electronically. Number of Addenda: 0 Note Initiated On: 03/18/2024 8:41 AM Scope Withdrawal Time: 0 hours 8 minutes 9 seconds  Total Procedure Duration: 0 hours 11 minutes 2 seconds  Estimated Blood Loss:  Estimated blood loss: none.      [facility]

## 2024-03-18 NOTE — Transfer of Care (Signed)
 Immediate Anesthesia Transfer of Care Note  Patient: Jonathan Ortega  Procedure(s) Performed: COLONOSCOPY (Rectum)  Patient Location: PACU  Anesthesia Type: General  Level of Consciousness: awake, alert  and patient cooperative  Airway and Oxygen Therapy: Patient Spontanous Breathing and Patient connected to supplemental oxygen  Post-op Assessment: Post-op Vital signs reviewed, Patient's Cardiovascular Status Stable, Respiratory Function Stable, Patent Airway and No signs of Nausea or vomiting  Post-op Vital Signs: Reviewed and stable  Complications: No notable events documented.

## 2024-03-18 NOTE — Anesthesia Postprocedure Evaluation (Signed)
 Anesthesia Post Note  Patient: Jonathan Ortega  Procedure(s) Performed: COLONOSCOPY (Rectum)  Patient location during evaluation: PACU Anesthesia Type: General Level of consciousness: awake and alert Pain management: pain level controlled Vital Signs Assessment: post-procedure vital signs reviewed and stable Respiratory status: spontaneous breathing, nonlabored ventilation, respiratory function stable and patient connected to nasal cannula oxygen Cardiovascular status: blood pressure returned to baseline and stable Postop Assessment: no apparent nausea or vomiting Anesthetic complications: no   No notable events documented.   Last Vitals:  Vitals:   03/18/24 0910 03/18/24 0915  BP: 121/72 119/74  Pulse: 63 (!) 57  Resp: 12 15  Temp: 36.6 C 36.6 C  SpO2: 98% 98%    Last Pain:  Vitals:   03/18/24 0915  TempSrc:   PainSc: 0-No pain                 Mohsin Crum C Ragina Fenter

## 2024-12-02 ENCOUNTER — Other Ambulatory Visit

## 2024-12-09 ENCOUNTER — Encounter: Admitting: Family Medicine
